# Patient Record
Sex: Male | Born: 1995 | Hispanic: Yes | Marital: Single | Smoking: Never smoker
Health system: Southern US, Community
[De-identification: ages and names within clinical notes are randomized; demographics above are authoritative.]

## PROBLEM LIST (undated history)

## (undated) HISTORY — PX: TONSILLECTOMY: SUR1361

---
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
## 2017-10-25 ENCOUNTER — Other Ambulatory Visit: Payer: Self-pay

## 2017-10-25 ENCOUNTER — Encounter: Payer: Self-pay | Admitting: Emergency Medicine

## 2017-10-25 ENCOUNTER — Emergency Department
Admission: EM | Admit: 2017-10-25 | Discharge: 2017-10-25 | Disposition: A | Discharge status: Home / Self Care | Payer: Medicaid Other | Attending: Emergency Medicine | Admitting: Emergency Medicine

## 2017-10-25 ENCOUNTER — Emergency Department: Payer: Medicaid Other

## 2017-10-25 DIAGNOSIS — R1031 Right lower quadrant pain: Secondary | ICD-10-CM | POA: Diagnosis not present

## 2017-10-25 LAB — URINALYSIS, COMPLETE (UACMP) WITH MICROSCOPIC
BACTERIA UA: NONE SEEN
BILIRUBIN URINE: NEGATIVE
Glucose, UA: NEGATIVE mg/dL
HGB URINE DIPSTICK: NEGATIVE
KETONES UR: NEGATIVE mg/dL
LEUKOCYTES UA: NEGATIVE
NITRITE: NEGATIVE
PH: 8 (ref 5.0–8.0)
Protein, ur: NEGATIVE mg/dL
RBC / HPF: NONE SEEN RBC/hpf (ref 0–5)
SPECIFIC GRAVITY, URINE: 1.01 (ref 1.005–1.030)
Squamous Epithelial / LPF: NONE SEEN
WBC UA: NONE SEEN WBC/hpf (ref 0–5)

## 2017-10-25 LAB — LIPASE, BLOOD: Lipase: 28 U/L (ref 11–51)

## 2017-10-25 LAB — COMPREHENSIVE METABOLIC PANEL
ALBUMIN: 4.8 g/dL (ref 3.5–5.0)
ALK PHOS: 87 U/L (ref 38–126)
ALT: 16 U/L — ABNORMAL LOW (ref 17–63)
ANION GAP: 9 (ref 5–15)
AST: 24 U/L (ref 15–41)
BILIRUBIN TOTAL: 0.6 mg/dL (ref 0.3–1.2)
BUN: 13 mg/dL (ref 6–20)
CALCIUM: 9.6 mg/dL (ref 8.9–10.3)
CO2: 27 mmol/L (ref 22–32)
Chloride: 104 mmol/L (ref 101–111)
Creatinine, Ser: 0.81 mg/dL (ref 0.61–1.24)
GFR calc Af Amer: >60 mL/min (ref >60)
GLUCOSE: 95 mg/dL (ref 65–99)
POTASSIUM: 4 mmol/L (ref 3.5–5.1)
Sodium: 140 mmol/L (ref 135–145)
TOTAL PROTEIN: 7.5 g/dL (ref 6.5–8.1)

## 2017-10-25 LAB — CBC
HEMATOCRIT: 43 % (ref 40.0–52.0)
Hemoglobin: 15.1 g/dL (ref 13.0–18.0)
MCH: 31.5 pg (ref 26.0–34.0)
MCHC: 35.2 g/dL (ref 32.0–36.0)
MCV: 89.3 fL (ref 80.0–100.0)
Platelets: 227 10*3/uL (ref 150–440)
RBC: 4.81 MIL/uL (ref 4.40–5.90)
RDW: 12.5 % (ref 11.5–14.5)
WBC: 9.7 10*3/uL (ref 3.8–10.6)

## 2017-10-25 MED ORDER — KETOROLAC TROMETHAMINE 30 MG/ML IJ SOLN
15.0000 mg | Freq: Once | INTRAMUSCULAR | Status: AC
  Administered 2017-10-25: 15 mg via INTRAVENOUS
  Filled 2017-10-25: qty 1

## 2017-10-25 MED ORDER — IOPAMIDOL (ISOVUE-300) INJECTION 61%
100.0000 mL | Freq: Once | INTRAVENOUS | Status: AC | PRN
  Administered 2017-10-25: 100 mL via INTRAVENOUS
  Filled 2017-10-25: qty 100

## 2017-10-25 NOTE — ED Provider Notes (Signed)
[facility] Emergency Department Provider Note  ____________________________________________  Time seen: Approximately 9:21 PM  I have reviewed the triage vital signs and the nursing notes.   HISTORY  Chief Complaint Abdominal Pain   HPI Lenard Gallowayrik Aleman Gutierrez is a 21 y.o. male no significant past medical history who presents for evaluation of periumbilical abdominal pain. Symptoms started yesterday. He describes the pain as sharp, 6 out of 10, located in his periumbilical/right lower quadrant, nonradiating. No nausea, vomiting, anorexia, fever, chills, dysuria, hematuria. Patient denies ever having similar pain.  History reviewed. No pertinent past medical history.  There are no active problems to display for this patient.   Past Surgical History:  Procedure Laterality Date  . TONSILLECTOMY      Prior to Admission medications   Not on File    Allergies Patient has no known allergies.  No family history on file.  Social History Social History   Tobacco Use  . Smoking status: Never Smoker  . Smokeless tobacco: Never Used  Substance Use Topics  . Alcohol use: No    Frequency: Never  . Drug use: No    Review of Systems  Constitutional: Negative for fever. Eyes: Negative for visual changes. ENT: Negative for sore throat. Neck: No neck pain  Cardiovascular: Negative for chest pain. Respiratory: Negative for shortness of breath. Gastrointestinal: + periumbilical/ RLQ abdominal pain. No vomiting or diarrhea. Genitourinary: Negative for dysuria. Musculoskeletal: Negative for back pain. Skin: Negative for rash. Neurological: Negative for headaches, weakness or numbness. Psych: No SI or HI  ____________________________________________   PHYSICAL EXAM:  VITAL SIGNS: ED Triage Vitals  Enc Vitals Group     BP 10/25/17 2026 (!) 143/65     Pulse Rate 10/25/17 2026 (!) 59     Resp 10/25/17 2026 18     Temp 10/25/17 2026 98.7 F  (37.1 C)     Temp Source 10/25/17 2026 Oral     SpO2 10/25/17 2026 100 %     Weight 10/25/17 2026 170 lb (77.1 kg)     Height 10/25/17 2026 5\' 8"  (1.727 m)     Head Circumference --      Peak Flow --      Pain Score 10/25/17 2025 5     Pain Loc --      Pain Edu? --      Excl. in GC? --     Constitutional: Alert and oriented. Well appearing and in no apparent distress. HEENT:      Head: Normocephalic and atraumatic.         Eyes: Conjunctivae are normal. Sclera is non-icteric.       Mouth/Throat: Mucous membranes are moist.       Neck: Supple with no signs of meningismus. Cardiovascular: Regular rate and rhythm. No murmurs, gallops, or rubs. 2+ symmetrical distal pulses are present in all extremities. No JVD. Respiratory: Normal respiratory effort. Lungs are clear to auscultation bilaterally. No wheezes, crackles, or rhonchi.  Gastrointestinal: Soft, ttp over the periumbilical and RLQ, and non distended with positive bowel sounds. No rebound or guarding. Bilateral testicles are descended with no tenderness to palpation, bilateral positive cremasteric reflexes are present, no swelling or erythema of the scrotum. No evidence of inguinal hernia. Musculoskeletal: Nontender with normal range of motion in all extremities. No edema, cyanosis, or erythema of extremities. Neurologic: Normal speech and language. Face is symmetric. Moving all extremities. No gross focal neurologic deficits are appreciated. Skin: Skin is warm, dry and intact.  No rash noted. Psychiatric: Mood and affect are normal. Speech and behavior are normal.  ____________________________________________   LABS (all labs ordered are listed, but only abnormal results are displayed)  Labs Reviewed  COMPREHENSIVE METABOLIC PANEL - Abnormal; Notable for the following components:      Result Value   ALT 16 (*)    All other components within normal limits  URINALYSIS, COMPLETE (UACMP) WITH MICROSCOPIC - Abnormal; Notable for  the following components:   Color, Urine STRAW (*)    APPearance CLEAR (*)    All other components within normal limits  CBC  LIPASE, BLOOD   ____________________________________________  EKG  none  ____________________________________________  RADIOLOGY  CT a/p: Negative, normal appendix ____________________________________________   PROCEDURES  Procedure(s) performed: None Procedures Critical Care performed:  None ____________________________________________   INITIAL IMPRESSION / ASSESSMENT AND PLAN / ED COURSE   21 y.o. male no significant past medical history who presents for evaluation of periumbilical abdominal pain since yesterday. Patient is well-appearing, in no distress, normal vital signs, he is tender to palpation on the periumbilical and right lower quadrant with no rebound or guarding. GU exam WNL. Ddx including appendicitis vs colitis vs diverticulitis vs epiploic appendagitis. Labs WNL. Will get CT a/p, will give toradol for pain.    _________________________ 10:25 PM on 10/25/2017 -----------------------------------------  CT and labs WNL with no acute findings. Pain resolved after toradol Will dc home on supportive care and f/u with PCP. Discussed return precautions with patient and mother   As part of my medical decision making, I reviewed the following data within the electronic MEDICAL RECORD NUMBER Nursing notes reviewed and incorporated, Labs reviewed , Radiograph reviewed , Notes from prior ED visits and [place] Controlled Substance Database    Pertinent labs & imaging results that were available during my care of the patient were reviewed by me and considered in my medical decision making (see chart for details).    ____________________________________________   FINAL CLINICAL IMPRESSION(S) / ED DIAGNOSES  Final diagnoses:  Right lower quadrant abdominal pain      NEW MEDICATIONS STARTED DURING THIS VISIT:  ED Discharge Orders    None        Note:  This document was prepared using Dragon voice recognition software and may include unintentional dictation errors.    Nita SickleVeronese, [place], MD 10/25/17 2227

## 2017-10-25 NOTE — ED Triage Notes (Signed)
Pt reports RLQ abd pain since Sunday. Denies vomiting or nausea. Pain increases with movement and touch. Pain also increases after eating. Last bowel movement was "a couple hours ago" and was said to be "normal". Denies fever at home.

## 2017-10-25 NOTE — Discharge Instructions (Signed)

## 2017-10-25 NOTE — ED Notes (Signed)
Patient transported to CT 

## 2018-09-07 ENCOUNTER — Emergency Department: Payer: No Typology Code available for payment source

## 2018-09-07 ENCOUNTER — Emergency Department
Admission: EM | Admit: 2018-09-07 | Discharge: 2018-09-08 | Disposition: A | Discharge status: Home / Self Care | Payer: No Typology Code available for payment source | Attending: Emergency Medicine | Admitting: Emergency Medicine

## 2018-09-07 DIAGNOSIS — M25522 Pain in left elbow: Secondary | ICD-10-CM | POA: Insufficient documentation

## 2018-09-07 DIAGNOSIS — T148XXA Other injury of unspecified body region, initial encounter: Secondary | ICD-10-CM | POA: Diagnosis not present

## 2018-09-07 DIAGNOSIS — R079 Chest pain, unspecified: Secondary | ICD-10-CM | POA: Insufficient documentation

## 2018-09-07 DIAGNOSIS — S8011XA Contusion of right lower leg, initial encounter: Secondary | ICD-10-CM | POA: Diagnosis not present

## 2018-09-07 DIAGNOSIS — M542 Cervicalgia: Secondary | ICD-10-CM | POA: Insufficient documentation

## 2018-09-07 DIAGNOSIS — Y92411 Interstate highway as the place of occurrence of the external cause: Secondary | ICD-10-CM | POA: Insufficient documentation

## 2018-09-07 DIAGNOSIS — R52 Pain, unspecified: Secondary | ICD-10-CM

## 2018-09-07 DIAGNOSIS — Y999 Unspecified external cause status: Secondary | ICD-10-CM | POA: Diagnosis not present

## 2018-09-07 DIAGNOSIS — Y9389 Activity, other specified: Secondary | ICD-10-CM | POA: Insufficient documentation

## 2018-09-07 DIAGNOSIS — R51 Headache: Secondary | ICD-10-CM | POA: Diagnosis not present

## 2018-09-07 DIAGNOSIS — S8991XA Unspecified injury of right lower leg, initial encounter: Secondary | ICD-10-CM | POA: Diagnosis present

## 2018-09-07 MED ORDER — ONDANSETRON 4 MG PO TBDP
4.0000 mg | ORAL_TABLET | Freq: Once | ORAL | Status: AC
  Administered 2018-09-07: 4 mg via ORAL
  Filled 2018-09-07: qty 1

## 2018-09-07 MED ORDER — HYDROMORPHONE HCL 1 MG/ML IJ SOLN
1.0000 mg | INTRAMUSCULAR | Status: AC
  Administered 2018-09-07: 1 mg via INTRAVENOUS

## 2018-09-07 MED ORDER — OXYCODONE-ACETAMINOPHEN 5-325 MG PO TABS: 1.0000 | ORAL_TABLET | ORAL | 0 refills | Status: DC | PRN

## 2018-09-07 MED ORDER — HYDROMORPHONE HCL 1 MG/ML IJ SOLN
INTRAMUSCULAR | Status: AC
  Administered 2018-09-07: 1 mg via INTRAVENOUS
  Filled 2018-09-07: qty 1

## 2018-09-07 MED ORDER — MORPHINE SULFATE (PF) 4 MG/ML IV SOLN
4.0000 mg | Freq: Once | INTRAVENOUS | Status: AC
  Administered 2018-09-07: 4 mg via INTRAMUSCULAR
  Filled 2018-09-07: qty 1

## 2018-09-07 MED ORDER — ONDANSETRON 4 MG PO TBDP: 4.0000 mg | ORAL_TABLET | Freq: Three times a day (TID) | ORAL | 0 refills | Status: DC | PRN
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
## 2018-09-07 NOTE — ED Notes (Signed)
C-collar in place. Pt sitting up and talking to MD without distress. Friend at bedside.
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
## 2019-01-24 ENCOUNTER — Emergency Department
Admission: EM | Admit: 2019-01-24 | Discharge: 2019-01-24 | Disposition: A | Discharge status: Home / Self Care | Payer: Medicaid Other | Attending: Emergency Medicine | Admitting: Emergency Medicine

## 2019-01-24 ENCOUNTER — Emergency Department: Payer: Medicaid Other

## 2019-01-24 ENCOUNTER — Encounter: Payer: Self-pay | Admitting: *Deleted

## 2019-01-24 ENCOUNTER — Other Ambulatory Visit: Payer: Self-pay

## 2019-01-24 DIAGNOSIS — J101 Influenza due to other identified influenza virus with other respiratory manifestations: Secondary | ICD-10-CM | POA: Insufficient documentation

## 2019-01-24 LAB — GROUP A STREP BY PCR: Group A Strep by PCR: NOT DETECTED

## 2019-01-24 LAB — INFLUENZA PANEL BY PCR (TYPE A & B)
Influenza A By PCR: POSITIVE — AB
Influenza B By PCR: NEGATIVE

## 2019-01-24 MED ORDER — ACETAMINOPHEN 500 MG PO TABS
1000.0000 mg | ORAL_TABLET | Freq: Once | ORAL | Status: AC
  Administered 2019-01-24: 1000 mg via ORAL

## 2019-01-24 MED ORDER — ACETAMINOPHEN 500 MG PO TABS
ORAL_TABLET | ORAL | Status: AC
  Filled 2019-01-24: qty 2

## 2019-01-24 MED ORDER — OSELTAMIVIR PHOSPHATE 75 MG PO CAPS: 75.0000 mg | ORAL_CAPSULE | Freq: Two times a day (BID) | ORAL | 0 refills | Status: AC

## 2019-01-24 NOTE — ED Notes (Signed)
Body aches and fever that began yesterday. Flu and strep swab sent. Po tylenol given in triage for fever.

## 2019-01-24 NOTE — ED Provider Notes (Signed)
[facility] Emergency Department Provider Note  ____________________________________________  Time seen: Approximately 8:24 PM  I have reviewed the triage vital signs and the nursing notes.   HISTORY  Chief Complaint Sore Throat and Cough    HPI Craig Peck is a 23 y.o. male presents to the emergency department with fever, headache, pharyngitis, body aches, chills and malaise for the past 24 hours.  Patient has had both emesis and diarrhea.  No recent travel outside of the country.  No known contact exposure with COVID 19.  Patient denies chest pain, chest tightness, shortness of breath or abdominal pain.  No other alleviating measures have been attempted.        No past medical history on file.  There are no active problems to display for this patient.   Past Surgical History:  Procedure Laterality Date  . TONSILLECTOMY      Prior to Admission medications   Medication Sig Start Date End Date Taking? Authorizing Provider  ondansetron (ZOFRAN ODT) 4 MG disintegrating tablet Take 1 tablet (4 mg total) by mouth every 8 (eight) hours as needed for nausea or vomiting. 09/07/18   Minna Antis, MD  oseltamivir (TAMIFLU) 75 MG capsule Take 1 capsule (75 mg total) by mouth 2 (two) times daily for 5 days. 01/24/19 01/29/19  Orvil Feil, PA-C  oxyCODONE-acetaminophen (PERCOCET) 5-325 MG tablet Take 1 tablet by mouth every 4 (four) hours as needed for severe pain. 09/07/18   Minna Antis, MD    Allergies Patient has no known allergies.  No family history on file.  Social History Social History   Tobacco Use  . Smoking status: Never Smoker  . Smokeless tobacco: Never Used  Substance Use Topics  . Alcohol use: No    Frequency: Never  . Drug use: No     Review of Systems  Constitutional: Patient has fever.  Eyes: No visual changes. No discharge ENT: Patient has congestion.  Cardiovascular: no chest pain. Respiratory:  Patient has cough.  Gastrointestinal: No abdominal pain.  No nausea, no vomiting. Patient had diarrhea.  Genitourinary: Negative for dysuria. No hematuria Musculoskeletal: Patient has myalgias.  Skin: Negative for rash, abrasions, lacerations, ecchymosis. Neurological: Patient has headache, no focal weakness or numbness.      ____________________________________________   PHYSICAL EXAM:  VITAL SIGNS: ED Triage Vitals  Enc Vitals Group     BP 01/24/19 1806 122/76     Pulse Rate 01/24/19 1806 (!) 108     Resp 01/24/19 1806 20     Temp 01/24/19 1806 (!) 103.1 F (39.5 C)     Temp Source 01/24/19 1806 Oral     SpO2 01/24/19 1807 96 %     Weight 01/24/19 1806 160 lb (72.6 kg)     Height 01/24/19 1806 5' (1.524 m)     Head Circumference --      Peak Flow --      Pain Score 01/24/19 1806 10     Pain Loc --      Pain Edu? --      Excl. in GC? --     Constitutional: Alert and oriented. Patient is lying supine. Eyes: Conjunctivae are normal. PERRL. EOMI. Head: Atraumatic. ENT:      Ears: Tympanic membranes are mildly injected with mild effusion bilaterally.       Nose: No congestion/rhinnorhea.      Mouth/Throat: Mucous membranes are moist. Posterior pharynx is mildly erythematous.  Hematological/Lymphatic/Immunilogical: No cervical lymphadenopathy.  Cardiovascular: Normal rate,  regular rhythm. Normal S1 and S2.  Good peripheral circulation. Respiratory: Normal respiratory effort without tachypnea or retractions. Lungs CTAB. Good air entry to the bases with no decreased or absent breath sounds. Gastrointestinal: Bowel sounds 4 quadrants. Soft and nontender to palpation. No guarding or rigidity. No palpable masses. No distention. No CVA tenderness. Musculoskeletal: Full range of motion to all extremities. No gross deformities appreciated. Neurologic:  Normal speech and language. No gross focal neurologic deficits are appreciated.  Skin:  Skin is warm, dry and intact. No rash  noted. Psychiatric: Mood and affect are normal. Speech and behavior are normal. Patient exhibits appropriate insight and judgement.    ____________________________________________   LABS (all labs ordered are listed, but only abnormal results are displayed)  Labs Reviewed  INFLUENZA PANEL BY PCR (TYPE A & B) - Abnormal; Notable for the following components:      Result Value   Influenza A By PCR POSITIVE (*)    All other components within normal limits  GROUP A STREP BY PCR   ____________________________________________  EKG   ____________________________________________  RADIOLOGY I personally viewed and evaluated these images as part of my medical decision making, as well as reviewing the written report by the radiologist.  Dg Chest 2 View  Result Date: 01/24/2019 CLINICAL DATA:  Cough and fevers EXAM: CHEST - 2 VIEW COMPARISON:  09/07/2018 FINDINGS: The heart size and mediastinal contours are within normal limits. Both lungs are clear. The visualized skeletal structures are unremarkable. IMPRESSION: No active cardiopulmonary disease. Electronically Signed   By: Alcide Clever M.D.   On: 01/24/2019 19:19    ____________________________________________    PROCEDURES  Procedure(s) performed:    Procedures    Medications  acetaminophen (TYLENOL) 500 MG tablet (has no administration in time range)  acetaminophen (TYLENOL) tablet 1,000 mg (1,000 mg Oral Given 01/24/19 1810)     ____________________________________________   INITIAL IMPRESSION / ASSESSMENT AND PLAN / ED COURSE  Pertinent labs & imaging results that were available during my care of the patient were reviewed by me and considered in my medical decision making (see chart for details).  Review of the [place] CSRS was performed in accordance of the [facility] prior to dispensing any controlled drugs.         Assessment and plan Influenza A Patient presents to the emergency department with flulike symptoms.   He tested positive for influenza A in the emergency department.  He opted to be treated empirically with Tamiflu.  Rest and hydration were encouraged at home.  Tylenol and ibuprofen alternating for fever recommended.  Strict return precautions were given to return to the emergency department for new or worsening symptoms.  All patient questions were answered.    ____________________________________________  FINAL CLINICAL IMPRESSION(S) / ED DIAGNOSES  Final diagnoses:  Influenza A      NEW MEDICATIONS STARTED DURING THIS VISIT:  ED Discharge Orders         Ordered    oseltamivir (TAMIFLU) 75 MG capsule  2 times daily     01/24/19 2021              This chart was dictated using voice recognition software/Dragon. Despite best efforts to proofread, errors can occur which can change the meaning. Any change was purely unintentional.    Gasper Lloyd 01/24/19 2028    Phineas Semen, MD 01/24/19 2035

## 2019-01-24 NOTE — ED Triage Notes (Signed)
Pt has a sore throat, cough and fever. Sx began yesterday.  Nonsmoker.  Pt alert.
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
## 2019-11-04 ENCOUNTER — Encounter: Payer: Self-pay | Admitting: Emergency Medicine

## 2019-11-04 ENCOUNTER — Other Ambulatory Visit: Payer: Self-pay

## 2019-11-04 ENCOUNTER — Emergency Department
Admission: EM | Admit: 2019-11-04 | Discharge: 2019-11-06 | Disposition: A | Discharge status: Home / Self Care | Payer: Medicaid Other | Attending: Emergency Medicine | Admitting: Emergency Medicine

## 2019-11-04 DIAGNOSIS — Z5321 Procedure and treatment not carried out due to patient leaving prior to being seen by health care provider: Secondary | ICD-10-CM | POA: Insufficient documentation

## 2019-11-04 DIAGNOSIS — R1031 Right lower quadrant pain: Secondary | ICD-10-CM | POA: Insufficient documentation

## 2019-11-04 DIAGNOSIS — R309 Painful micturition, unspecified: Secondary | ICD-10-CM | POA: Insufficient documentation

## 2019-11-04 LAB — CBC
HCT: 42.5 % (ref 39.0–52.0)
Hemoglobin: 15.2 g/dL (ref 13.0–17.0)
MCH: 30 pg (ref 26.0–34.0)
MCHC: 35.8 g/dL (ref 30.0–36.0)
MCV: 83.8 fL (ref 80.0–100.0)
Platelets: 273 10*3/uL (ref 150–400)
RBC: 5.07 MIL/uL (ref 4.22–5.81)
RDW: 11.9 % (ref 11.5–15.5)
WBC: 7.6 10*3/uL (ref 4.0–10.5)
nRBC: 0 % (ref 0.0–0.2)

## 2019-11-04 LAB — COMPREHENSIVE METABOLIC PANEL
ALT: 15 U/L (ref 0–44)
AST: 19 U/L (ref 15–41)
Albumin: 4.9 g/dL (ref 3.5–5.0)
Alkaline Phosphatase: 68 U/L (ref 38–126)
Anion gap: 10 (ref 5–15)
BUN: 9 mg/dL (ref 6–20)
CO2: 25 mmol/L (ref 22–32)
Calcium: 9.5 mg/dL (ref 8.9–10.3)
Chloride: 107 mmol/L (ref 98–111)
Creatinine, Ser: 0.89 mg/dL (ref 0.61–1.24)
GFR calc Af Amer: >60 mL/min (ref >60)
GFR calc non Af Amer: >60 mL/min (ref >60)
Glucose, Bld: 106 mg/dL — ABNORMAL HIGH (ref 70–99)
Potassium: 3.6 mmol/L (ref 3.5–5.1)
Sodium: 142 mmol/L (ref 135–145)
Total Bilirubin: 0.6 mg/dL (ref 0.3–1.2)
Total Protein: 7.8 g/dL (ref 6.5–8.1)

## 2019-11-04 LAB — URINALYSIS, COMPLETE (UACMP) WITH MICROSCOPIC
Bacteria, UA: NONE SEEN
Bilirubin Urine: NEGATIVE
Glucose, UA: NEGATIVE mg/dL
Hgb urine dipstick: NEGATIVE
Ketones, ur: NEGATIVE mg/dL
Leukocytes,Ua: NEGATIVE
Nitrite: NEGATIVE
Protein, ur: NEGATIVE mg/dL
Specific Gravity, Urine: 1.004 — ABNORMAL LOW (ref 1.005–1.030)
Squamous Epithelial / HPF: NONE SEEN (ref 0–5)
pH: 6 (ref 5.0–8.0)

## 2019-11-04 LAB — LIPASE, BLOOD: Lipase: 25 U/L (ref 11–51)

## 2019-11-04 NOTE — ED Triage Notes (Signed)
Pt ambulatory to triage with no difficulty. Reports pin to his right lower abd that started on Thursday. Pt denies n/v/d but reports occasional pain with urination. Pt thinks it could be his appendix.

## 2019-12-20 ENCOUNTER — Encounter: Payer: Self-pay | Admitting: Emergency Medicine

## 2019-12-20 ENCOUNTER — Emergency Department
Admission: EM | Admit: 2019-12-20 | Discharge: 2019-12-20 | Disposition: A | Discharge status: Home / Self Care | Payer: Medicaid Other | Attending: Student in an Organized Health Care Education/Training Program | Admitting: Student in an Organized Health Care Education/Training Program

## 2019-12-20 ENCOUNTER — Emergency Department: Payer: Medicaid Other

## 2019-12-20 ENCOUNTER — Other Ambulatory Visit: Payer: Self-pay

## 2019-12-20 DIAGNOSIS — J029 Acute pharyngitis, unspecified: Secondary | ICD-10-CM | POA: Insufficient documentation

## 2019-12-20 DIAGNOSIS — J209 Acute bronchitis, unspecified: Secondary | ICD-10-CM | POA: Insufficient documentation

## 2019-12-20 DIAGNOSIS — R05 Cough: Secondary | ICD-10-CM | POA: Insufficient documentation

## 2019-12-20 DIAGNOSIS — R0789 Other chest pain: Secondary | ICD-10-CM | POA: Insufficient documentation

## 2019-12-20 DIAGNOSIS — Z20822 Contact with and (suspected) exposure to covid-19: Secondary | ICD-10-CM | POA: Insufficient documentation

## 2019-12-20 MED ORDER — AZITHROMYCIN 250 MG PO TABS: ORAL_TABLET | ORAL | 0 refills | Status: DC

## 2019-12-20 MED ORDER — PREDNISONE 10 MG (21) PO TBPK: ORAL_TABLET | ORAL | 0 refills | Status: DC

## 2019-12-20 NOTE — Discharge Instructions (Signed)
Follow-up with your regular doctor if not improving in 3 days.  Return emergency department worsening.  Take medications as prescribed. 

## 2019-12-20 NOTE — ED Provider Notes (Signed)
[facility] Emergency Department Provider Note  ____________________________________________   First MD Initiated Contact with Patient 12/20/19 1020     (approximate)  I have reviewed the triage vital signs and the nursing notes.   HISTORY  Chief Complaint Cough and Sore Throat    HPI Craig Peck is a 24 y.o. male presents emergency department complaining of cough and chest hurting with the cough.  States symptoms for 1 week.  Had a negative Covid test last week.  He denies any cardiac type chest pain or shortness of breath.  He denies any swelling in extremities.  No fever or chills.  Remainder review of systems is negative    History reviewed. No pertinent past medical history.  There are no problems to display for this patient.   Past Surgical History:  Procedure Laterality Date  . TONSILLECTOMY      Prior to Admission medications   Medication Sig Start Date End Date Taking? Authorizing Provider  azithromycin (ZITHROMAX Z-PAK) 250 MG tablet 2 pills today then 1 pill a day for 4 days 12/20/19   Sherrie Mustache Roselyn Bering, PA-C  predniSONE (STERAPRED UNI-PAK 21 TAB) 10 MG (21) TBPK tablet Take 6 pills on day one then decrease by 1 pill each day 12/20/19   Faythe Ghee, PA-C    Allergies Patient has no known allergies.  No family history on file.  Social History Social History   Tobacco Use  . Smoking status: Never Smoker  . Smokeless tobacco: Never Used  Substance Use Topics  . Alcohol use: No  . Drug use: No    Review of Systems  Constitutional: No fever/chills Eyes: No visual changes. ENT: No sore throat. Respiratory: Positive cough with chest discomfort Cardiovascular: Denies chest pain Gastrointestinal: Denies abdominal pain Genitourinary: Negative for dysuria. Musculoskeletal: Negative for back pain. Skin: Negative for rash. Psychiatric: no mood changes,     ____________________________________________   PHYSICAL  EXAM:  VITAL SIGNS: ED Triage Vitals  Enc Vitals Group     BP 12/20/19 1019 (!) 114/92     Pulse Rate 12/20/19 1019 87     Resp 12/20/19 1019 18     Temp 12/20/19 1019 98.7 F (37.1 C)     Temp Source 12/20/19 1019 Oral     SpO2 12/20/19 1019 99 %     Weight 12/20/19 1003 172 lb (78 kg)     Height 12/20/19 1003 5\' 8"  (1.727 m)     Head Circumference --      Peak Flow --      Pain Score 12/20/19 1002 9     Pain Loc --      Pain Edu? --      Excl. in GC? --     Constitutional: Alert and oriented. Well appearing and in no acute distress. Eyes: Conjunctivae are normal.  Head: Atraumatic. Nose: No congestion/rhinnorhea. Mouth/Throat: Mucous membranes are moist.   Neck:  supple no lymphadenopathy noted Cardiovascular: Normal rate, regular rhythm. Heart sounds are normal Respiratory: Normal respiratory effort.  No retractions, lungs c t a  GU: deferred Musculoskeletal: FROM all extremities, warm and well perfused Neurologic:  Normal speech and language.  Skin:  Skin is warm, dry and intact. No rash noted. Psychiatric: Mood and affect are normal. Speech and behavior are normal.  ____________________________________________   LABS (all labs ordered are listed, but only abnormal results are displayed)  Labs Reviewed  SARS CORONAVIRUS 2 (TAT 6-24 HRS)   ____________________________________________   ____________________________________________  RADIOLOGY  Chest x-ray is normal  ____________________________________________   PROCEDURES  Procedure(s) performed: No  Procedures    ____________________________________________   INITIAL IMPRESSION / ASSESSMENT AND PLAN / ED COURSE  Pertinent labs & imaging results that were available during my care of the patient were reviewed by me and considered in my medical decision making (see chart for details).   Patient is 24 year old male presents emergency department Covid-like symptoms.  Cough and chest discomfort with  cough.  Remainder review systems is negative  Physical exam patient's vitals are normal.  He appears well.  Chest x-ray is normal  Explained findings to the patient.  He is placed on a Z-Pak and steroid pack.  We repeated his Covid test.  Kept him out of work until he receives his Covid test results.  States he understands and will comply.  He was discharged in stable condition.    Craig Peck was evaluated in Emergency Department on 12/20/2019 for the symptoms described in the history of present illness. He was evaluated in the context of the global COVID-19 pandemic, which necessitated consideration that the patient might be at risk for infection with the SARS-CoV-2 virus that causes COVID-19. Institutional protocols and algorithms that pertain to the evaluation of patients at risk for COVID-19 are in a state of rapid change based on information released by regulatory bodies including the CDC and federal and state organizations. These policies and algorithms were followed during the patient's care in the ED.   As part of my medical decision making, I reviewed the following data within the [place] notes reviewed and incorporated, Old chart reviewed, Radiograph reviewed x-ray is normal, Notes from prior ED visits and [place] Controlled Substance Database  ____________________________________________   FINAL CLINICAL IMPRESSION(S) / ED DIAGNOSES  Final diagnoses:  Suspected COVID-19 virus infection  Acute bronchitis, unspecified organism      NEW MEDICATIONS STARTED DURING THIS VISIT:  Current Discharge Medication List    START taking these medications   Details  azithromycin (ZITHROMAX Z-PAK) 250 MG tablet 2 pills today then 1 pill a day for 4 days Qty: 6 each, Refills: 0    predniSONE (STERAPRED UNI-PAK 21 TAB) 10 MG (21) TBPK tablet Take 6 pills on day one then decrease by 1 pill each day Qty: 21 tablet, Refills: 0         Note:  This  document was prepared using Dragon voice recognition software and may include unintentional dictation errors.    Versie Starks, PA-C 12/20/19 1125    Merlyn Lot, MD 12/20/19 1137

## 2019-12-20 NOTE — ED Triage Notes (Signed)
Pt reports productive cough with green sputum for the last couple of days. Pt reports pain to throat area as well.

## 2019-12-20 NOTE — ED Triage Notes (Signed)
Pt reports chest hurts when he coughs.

## 2019-12-20 NOTE — ED Notes (Signed)
Radiology to bedside. 

## 2019-12-21 LAB — SARS CORONAVIRUS 2 (TAT 6-24 HRS): SARS Coronavirus 2: NEGATIVE

## 2020-04-22 ENCOUNTER — Emergency Department
Admission: EM | Admit: 2020-04-22 | Discharge: 2020-04-22 | Disposition: A | Discharge status: Home / Self Care | Payer: Medicaid Other | Attending: Emergency Medicine | Admitting: Emergency Medicine

## 2020-04-22 ENCOUNTER — Emergency Department: Payer: Medicaid Other

## 2020-04-22 ENCOUNTER — Other Ambulatory Visit: Payer: Self-pay

## 2020-04-22 DIAGNOSIS — R0602 Shortness of breath: Secondary | ICD-10-CM | POA: Insufficient documentation

## 2020-04-22 DIAGNOSIS — R0789 Other chest pain: Secondary | ICD-10-CM | POA: Insufficient documentation

## 2020-04-22 DIAGNOSIS — Z5321 Procedure and treatment not carried out due to patient leaving prior to being seen by health care provider: Secondary | ICD-10-CM | POA: Insufficient documentation

## 2020-04-22 LAB — BASIC METABOLIC PANEL
Anion gap: 10 (ref 5–15)
BUN: 9 mg/dL (ref 6–20)
CO2: 25 mmol/L (ref 22–32)
Calcium: 9.4 mg/dL (ref 8.9–10.3)
Chloride: 107 mmol/L (ref 98–111)
Creatinine, Ser: 1.08 mg/dL (ref 0.61–1.24)
GFR calc Af Amer: >60 mL/min (ref >60)
GFR calc non Af Amer: >60 mL/min (ref >60)
Glucose, Bld: 102 mg/dL — ABNORMAL HIGH (ref 70–99)
Potassium: 3.9 mmol/L (ref 3.5–5.1)
Sodium: 142 mmol/L (ref 135–145)

## 2020-04-22 LAB — CBC
HCT: 45.1 % (ref 39.0–52.0)
Hemoglobin: 15.4 g/dL (ref 13.0–17.0)
MCH: 30.4 pg (ref 26.0–34.0)
MCHC: 34.1 g/dL (ref 30.0–36.0)
MCV: 89 fL (ref 80.0–100.0)
Platelets: 274 10*3/uL (ref 150–400)
RBC: 5.07 MIL/uL (ref 4.22–5.81)
RDW: 11.9 % (ref 11.5–15.5)
WBC: 8.5 10*3/uL (ref 4.0–10.5)
nRBC: 0 % (ref 0.0–0.2)

## 2020-04-22 LAB — TROPONIN I (HIGH SENSITIVITY): Troponin I (High Sensitivity): 3 ng/L (ref <18)

## 2020-04-22 MED ORDER — SODIUM CHLORIDE 0.9% FLUSH: 3.0000 mL | Freq: Once | INTRAVENOUS | Status: DC

## 2020-04-22 NOTE — ED Notes (Signed)
No answer when called several times from lobby & on phone # listed in chart

## 2020-04-22 NOTE — ED Notes (Signed)
No answer when called several times from lobby & on phone # listed in chart 

## 2020-04-22 NOTE — ED Triage Notes (Signed)
Pt comes via POV from home with c/o left sided CP and SOB. Pt states this started yesterday and has since gotten worse.  Pt states it is worse when he goes outside he has more trouble breathing.

## 2021-12-12 ENCOUNTER — Emergency Department: Payer: Self-pay

## 2021-12-12 ENCOUNTER — Emergency Department
Admission: EM | Admit: 2021-12-12 | Discharge: 2021-12-13 | Disposition: A | Discharge status: Home / Self Care | Payer: Self-pay | Attending: Emergency Medicine | Admitting: Emergency Medicine

## 2021-12-12 ENCOUNTER — Other Ambulatory Visit: Payer: Self-pay

## 2021-12-12 DIAGNOSIS — Z20822 Contact with and (suspected) exposure to covid-19: Secondary | ICD-10-CM | POA: Insufficient documentation

## 2021-12-12 DIAGNOSIS — A084 Viral intestinal infection, unspecified: Secondary | ICD-10-CM | POA: Insufficient documentation

## 2021-12-12 DIAGNOSIS — R319 Hematuria, unspecified: Secondary | ICD-10-CM | POA: Insufficient documentation

## 2021-12-12 LAB — COMPREHENSIVE METABOLIC PANEL
ALT: 18 U/L (ref 0–44)
AST: 21 U/L (ref 15–41)
Albumin: 3.9 g/dL (ref 3.5–5.0)
Alkaline Phosphatase: 43 U/L (ref 38–126)
Anion gap: 9 (ref 5–15)
BUN: 8 mg/dL (ref 6–20)
CO2: 27 mmol/L (ref 22–32)
Calcium: 9 mg/dL (ref 8.9–10.3)
Chloride: 103 mmol/L (ref 98–111)
Creatinine, Ser: 0.9 mg/dL (ref 0.61–1.24)
GFR, Estimated: >60 mL/min (ref >60)
Glucose, Bld: 102 mg/dL — ABNORMAL HIGH (ref 70–99)
Potassium: 3.5 mmol/L (ref 3.5–5.1)
Sodium: 139 mmol/L (ref 135–145)
Total Bilirubin: 0.7 mg/dL (ref 0.3–1.2)
Total Protein: 6.9 g/dL (ref 6.5–8.1)

## 2021-12-12 LAB — URINALYSIS, ROUTINE W REFLEX MICROSCOPIC
Bilirubin Urine: NEGATIVE
Glucose, UA: NEGATIVE mg/dL
Hgb urine dipstick: NEGATIVE
Leukocytes,Ua: NEGATIVE
Nitrite: NEGATIVE
Protein, ur: NEGATIVE mg/dL
Specific Gravity, Urine: 1.02 (ref 1.005–1.030)
pH: 5.5 (ref 5.0–8.0)

## 2021-12-12 LAB — RESP PANEL BY RT-PCR (FLU A&B, COVID) ARPGX2
Influenza A by PCR: NEGATIVE
Influenza B by PCR: NEGATIVE
SARS Coronavirus 2 by RT PCR: NEGATIVE

## 2021-12-12 LAB — CBC
HCT: 44.7 % (ref 39.0–52.0)
Hemoglobin: 15.7 g/dL (ref 13.0–17.0)
MCH: 31.5 pg (ref 26.0–34.0)
MCHC: 35.1 g/dL (ref 30.0–36.0)
MCV: 89.6 fL (ref 80.0–100.0)
Platelets: 225 10*3/uL (ref 150–400)
RBC: 4.99 MIL/uL (ref 4.22–5.81)
RDW: 11.6 % (ref 11.5–15.5)
WBC: 5.7 10*3/uL (ref 4.0–10.5)
nRBC: 0 % (ref 0.0–0.2)

## 2021-12-12 LAB — LIPASE, BLOOD: Lipase: 25 U/L (ref 11–51)

## 2021-12-12 MED ORDER — DICYCLOMINE HCL 10 MG PO CAPS: 10.0000 mg | ORAL_CAPSULE | Freq: Three times a day (TID) | ORAL | 0 refills | Status: DC | PRN

## 2021-12-12 MED ORDER — ONDANSETRON 4 MG PO TBDP: 4.0000 mg | ORAL_TABLET | Freq: Three times a day (TID) | ORAL | 0 refills | Status: DC | PRN

## 2021-12-12 NOTE — ED Notes (Signed)
Pt provided urine cup

## 2021-12-12 NOTE — ED Provider Notes (Signed)
[facility] Provider Note  Patient Contact: 9:23 PM (approximate)   History   No chief complaint on file.   HPI  Craig Peck is a 26 y.o. male who presents emergency department for 2 to 3 days of symptoms of nausea, vomiting.  Patient states that today he noticed some blood in his urine.  He has been having some diffuse abdominal pain with what he described as intermittent pain into his groin but that is all been present for several months.  No history of nephrolithiasis.  Patient denies any difficulty urinating.  Patient states that while he has been having some of these viral GI symptoms, his family has also been experiencing same.  They seem to be improving though the patient has not improved at this time.  No nasal congestion, sore throat or cough.  No chronic GI complaints.     Physical Exam   Triage Vital Signs: ED Triage Vitals  Enc Vitals Group     BP 12/12/21 1814 107/87     Pulse Rate 12/12/21 1814 77     Resp 12/12/21 2051 14     Temp 12/12/21 1814 98.2 F (36.8 C)     Temp Source 12/12/21 1814 Oral     SpO2 12/12/21 1814 100 %     Weight 12/12/21 1811 160 lb (72.6 kg)     Height 12/12/21 1811 5\' 8"  (1.727 m)     Head Circumference --      Peak Flow --      Pain Score 12/12/21 1811 6     Pain Loc --      Pain Edu? --      Excl. in GC? --     Most recent vital signs: Vitals:   12/12/21 1814 12/12/21 2051  BP: 107/87   Pulse: 77   Resp:  14  Temp: 98.2 F (36.8 C)   SpO2: 100%      General: Alert and in no acute distress. ENT:      Ears:       Nose: No congestion/rhinnorhea.      Mouth/Throat: Mucous membranes are moist. Neck: No stridor. No cervical spine tenderness to palpation Hematological/Lymphatic/Immunilogical: No cervical lymphadenopathy. Cardiovascular:  Good peripheral perfusion Respiratory: Normal respiratory effort without tachypnea or retractions. Lungs CTAB. Good air entry to the bases with no  decreased or absent breath sounds. Gastrointestinal: Bowel sounds 4 quadrants. Soft and nontender to palpation. No guarding or rigidity. No palpable masses. No distention. No CVA tenderness. Musculoskeletal: Full range of motion to all extremities.  Neurologic:  No gross focal neurologic deficits are appreciated.  Skin:   No rash noted Other:   ED Results / Procedures / Treatments   Labs (all labs ordered are listed, but only abnormal results are displayed) Labs Reviewed  COMPREHENSIVE METABOLIC PANEL - Abnormal; Notable for the following components:      Result Value   Glucose, Bld 102 (*)    All other components within normal limits  URINALYSIS, ROUTINE W REFLEX MICROSCOPIC - Abnormal; Notable for the following components:   Ketones, ur TRACE (*)    All other components within normal limits  RESP PANEL BY RT-PCR (FLU A&B, COVID) ARPGX2  LIPASE, BLOOD  CBC     EKG     RADIOLOGY  I personally viewed and evaluated these images as part of my medical decision making, as well as reviewing the written report by the radiologist.  ED Provider Interpretation: No acute findings on  CT scan of the abdomen and pelvis specifically no appendicitis, cholecystitis, nephrolithiasis  CT Renal Stone Study  Result Date: 12/12/2021 CLINICAL DATA:  Flank pain, kidney stone suspected. Vomiting. Hematuria. EXAM: CT ABDOMEN AND PELVIS WITHOUT CONTRAST TECHNIQUE: Multidetector CT imaging of the abdomen and pelvis was performed following the standard protocol without IV contrast. RADIATION DOSE REDUCTION: This exam was performed according to the departmental dose-optimization program which includes automated exposure control, adjustment of the mA and/or kV according to patient size and/or use of iterative reconstruction technique. COMPARISON:  CT abdomen dated 10/25/2017. FINDINGS: Lower chest: No acute abnormality. Hepatobiliary: No focal liver abnormality is seen. Gallbladder appears normal. No bile  duct dilatation seen. Pancreas: Unremarkable. No pancreatic ductal dilatation or surrounding inflammatory changes. Spleen: Normal in size without focal abnormality. Adrenals/Urinary Tract: Adrenal glands appear normal. Kidneys are unremarkable without stone or hydronephrosis. No perinephric inflammation. No ureteral or bladder calculi are identified. Bladder is unremarkable, partially decompressed. Stomach/Bowel: No dilated large or small bowel loops. No evidence of bowel wall inflammation. Appendix is normal. Stomach is unremarkable. Vascular/Lymphatic: No abdominal aortic aneurysm. No enlarged lymph nodes are seen within the abdomen or pelvis. Reproductive: Prostate is unremarkable. Other: No free fluid or abscess collection is seen within the abdomen or pelvis. No free intraperitoneal air. Musculoskeletal: Osseous structures are unremarkable. Superficial soft tissues of the abdomen and pelvis are unremarkable. IMPRESSION: No acute findings within the abdomen or pelvis. No renal or ureteral calculi. No bowel obstruction or evidence of bowel wall inflammation. Appendix is normal. Electronically Signed   By: Franki Cabot M.D.   On: 12/12/2021 21:55    PROCEDURES:  Critical Care performed: No  Procedures   MEDICATIONS ORDERED IN ED: Medications - No data to display   IMPRESSION / MDM / [place] / ED COURSE  I reviewed the triage vital signs and the nursing notes.                              Differential diagnosis includes, but is not limited to, viral illness, nephrolithiasis, appendicitis, cholecystitis   Patient's diagnosis is consistent with viral illness.  Patient presented to the emergency department with GI symptoms which the family is sharing.  Patient states that multiple family members have nausea, vomiting, diarrhea.  Patient is having similar symptoms but had some blood in his urine earlier today.  This is cleared up and has had no return of same.  No history of kidney  stones.  Overall exam was reassuring and patient has not been vomiting while here.  Given the blood in his urine patient did have a CT scan to ensure no evidence of nephrolithiasis.  CT was reassuring with no kidney stones, bowel obstruction, colitis, cholecystitis or appendicitis.  Patient will have symptom control medicines of Zofran and Bentyl.  Follow-up primary care as needed.  Return precautions discussed with the patient at this time. Patient is given ED precautions to return to the ED for any worsening or new symptoms.       FINAL CLINICAL IMPRESSION(S) / ED DIAGNOSES   Final diagnoses:  Viral gastroenteritis  Hematuria, unspecified type     Rx / DC Orders   ED Discharge Orders     None        Note:  This document was prepared using Dragon voice recognition software and may include unintentional dictation errors.   Brynda Peon 12/12/21 2306    Carrie Mew,  MD 12/16/21 1526

## 2021-12-12 NOTE — ED Notes (Signed)
Pt requesting no visitors, first nurse (Tiffany) called and informed not to let anyone back for pt. Note left on ED tracker as well.

## 2021-12-12 NOTE — ED Triage Notes (Signed)
Pt to ED via POV. Pt stating he started to vomited and went to the restroom and noticed blood in his urine this morning. Pt reports pain in his groin and abdomen. Pt denies abnormal discharge.

## 2021-12-13 NOTE — ED Notes (Signed)
E signature pad not working. Pt educated on discharge instructions and verbalized understanding.  

## 2022-02-28 ENCOUNTER — Inpatient Hospital Stay
Admission: EM | Admit: 2022-02-28 | Discharge: 2022-03-05 | DRG: 683 | Disposition: A | Discharge status: Home / Self Care | Payer: Medicaid Other | Attending: Internal Medicine | Admitting: Internal Medicine

## 2022-02-28 ENCOUNTER — Other Ambulatory Visit: Payer: Self-pay

## 2022-02-28 ENCOUNTER — Emergency Department: Payer: Medicaid Other

## 2022-02-28 ENCOUNTER — Encounter: Payer: Self-pay | Admitting: *Deleted

## 2022-02-28 DIAGNOSIS — L299 Pruritus, unspecified: Secondary | ICD-10-CM

## 2022-02-28 DIAGNOSIS — R109 Unspecified abdominal pain: Principal | ICD-10-CM

## 2022-02-28 DIAGNOSIS — R809 Proteinuria, unspecified: Secondary | ICD-10-CM | POA: Diagnosis present

## 2022-02-28 DIAGNOSIS — F17290 Nicotine dependence, other tobacco product, uncomplicated: Secondary | ICD-10-CM | POA: Diagnosis present

## 2022-02-28 DIAGNOSIS — R17 Unspecified jaundice: Secondary | ICD-10-CM | POA: Diagnosis present

## 2022-02-28 DIAGNOSIS — M549 Dorsalgia, unspecified: Secondary | ICD-10-CM

## 2022-02-28 DIAGNOSIS — R030 Elevated blood-pressure reading, without diagnosis of hypertension: Secondary | ICD-10-CM | POA: Diagnosis present

## 2022-02-28 DIAGNOSIS — N179 Acute kidney failure, unspecified: Principal | ICD-10-CM | POA: Diagnosis present

## 2022-02-28 LAB — PROTEIN / CREATININE RATIO, URINE
Creatinine, Urine: 96 mg/dL
Protein Creatinine Ratio: 3.06 mg/mg{Cre} — ABNORMAL HIGH (ref 0.00–0.15)
Total Protein, Urine: 294 mg/dL

## 2022-02-28 LAB — HEPATITIS B SURFACE ANTIGEN: Hepatitis B Surface Ag: NONREACTIVE

## 2022-02-28 LAB — COMPREHENSIVE METABOLIC PANEL
ALT: 20 U/L (ref 0–44)
AST: 28 U/L (ref 15–41)
Albumin: 4.3 g/dL (ref 3.5–5.0)
Alkaline Phosphatase: 57 U/L (ref 38–126)
Anion gap: 8 (ref 5–15)
BUN: 26 mg/dL — ABNORMAL HIGH (ref 6–20)
CO2: 24 mmol/L (ref 22–32)
Calcium: 8.8 mg/dL — ABNORMAL LOW (ref 8.9–10.3)
Chloride: 107 mmol/L (ref 98–111)
Creatinine, Ser: 2.3 mg/dL — ABNORMAL HIGH (ref 0.61–1.24)
GFR, Estimated: 40 mL/min — ABNORMAL LOW (ref >60)
Glucose, Bld: 93 mg/dL (ref 70–99)
Potassium: 3.8 mmol/L (ref 3.5–5.1)
Sodium: 139 mmol/L (ref 135–145)
Total Bilirubin: 1.6 mg/dL — ABNORMAL HIGH (ref 0.3–1.2)
Total Protein: 6.9 g/dL (ref 6.5–8.1)

## 2022-02-28 LAB — URINALYSIS, ROUTINE W REFLEX MICROSCOPIC
Bilirubin Urine: NEGATIVE
Glucose, UA: NEGATIVE mg/dL
Ketones, ur: NEGATIVE mg/dL
Leukocytes,Ua: NEGATIVE
Nitrite: NEGATIVE
Protein, ur: >=300 mg/dL — AB
Specific Gravity, Urine: 1.005 (ref 1.005–1.030)
Squamous Epithelial / HPF: NONE SEEN (ref 0–5)
pH: 6 (ref 5.0–8.0)

## 2022-02-28 LAB — RETICULOCYTES
Immature Retic Fract: 5.3 % (ref 2.3–15.9)
RBC.: 4.35 MIL/uL (ref 4.22–5.81)
Retic Count, Absolute: 46.5 10*3/uL (ref 19.0–186.0)
Retic Ct Pct: 1.1 % (ref 0.4–3.1)

## 2022-02-28 LAB — CBC WITH DIFFERENTIAL/PLATELET
Abs Immature Granulocytes: 0.04 10*3/uL (ref 0.00–0.07)
Basophils Absolute: 0 10*3/uL (ref 0.0–0.1)
Basophils Relative: 0 %
Eosinophils Absolute: 0.1 10*3/uL (ref 0.0–0.5)
Eosinophils Relative: 1 %
HCT: 41 % (ref 39.0–52.0)
Hemoglobin: 13.8 g/dL (ref 13.0–17.0)
Immature Granulocytes: 0 %
Lymphocytes Relative: 9 %
Lymphs Abs: 0.9 10*3/uL (ref 0.7–4.0)
MCH: 30.3 pg (ref 26.0–34.0)
MCHC: 33.7 g/dL (ref 30.0–36.0)
MCV: 89.9 fL (ref 80.0–100.0)
Monocytes Absolute: 1.1 10*3/uL — ABNORMAL HIGH (ref 0.1–1.0)
Monocytes Relative: 12 %
Neutro Abs: 7.3 10*3/uL (ref 1.7–7.7)
Neutrophils Relative %: 78 %
Platelets: 178 10*3/uL (ref 150–400)
RBC: 4.56 MIL/uL (ref 4.22–5.81)
RDW: 11.8 % (ref 11.5–15.5)
WBC: 9.5 10*3/uL (ref 4.0–10.5)
nRBC: 0 % (ref 0.0–0.2)

## 2022-02-28 LAB — HEMOGLOBIN A1C
Hgb A1c MFr Bld: 5.3 % (ref 4.8–5.6)
Mean Plasma Glucose: 105.41 mg/dL

## 2022-02-28 LAB — HIV ANTIBODY (ROUTINE TESTING W REFLEX): HIV Screen 4th Generation wRfx: NONREACTIVE

## 2022-02-28 LAB — CK: Total CK: 163 U/L (ref 49–397)

## 2022-02-28 MED ORDER — CYCLOBENZAPRINE HCL 10 MG PO TABS
10.0000 mg | ORAL_TABLET | Freq: Three times a day (TID) | ORAL | Status: DC | PRN
  Administered 2022-02-28: 10 mg via ORAL
  Filled 2022-02-28: qty 1

## 2022-02-28 MED ORDER — HYDROXYZINE HCL 25 MG PO TABS
25.0000 mg | ORAL_TABLET | Freq: Three times a day (TID) | ORAL | Status: DC | PRN
  Administered 2022-02-28: 25 mg via ORAL
  Filled 2022-02-28: qty 1

## 2022-02-28 MED ORDER — TRAMADOL HCL 50 MG PO TABS
50.0000 mg | ORAL_TABLET | Freq: Three times a day (TID) | ORAL | Status: DC | PRN
  Administered 2022-02-28: 50 mg via ORAL
  Filled 2022-02-28: qty 1

## 2022-02-28 MED ORDER — SODIUM CHLORIDE 0.9 % IV BOLUS
1000.0000 mL | Freq: Once | INTRAVENOUS | Status: AC
  Administered 2022-02-28: 1000 mL via INTRAVENOUS

## 2022-02-28 MED ORDER — HYDROMORPHONE HCL 1 MG/ML IJ SOLN
1.0000 mg | Freq: Once | INTRAMUSCULAR | Status: AC
  Administered 2022-02-28: 1 mg via INTRAVENOUS
  Filled 2022-02-28: qty 1

## 2022-02-28 MED ORDER — KETOROLAC TROMETHAMINE 15 MG/ML IJ SOLN
15.0000 mg | Freq: Once | INTRAMUSCULAR | Status: AC
Start: 2022-02-28 — End: 2022-02-28
  Administered 2022-02-28: 15 mg via INTRAVENOUS
  Filled 2022-02-28: qty 1

## 2022-02-28 MED ORDER — SODIUM CHLORIDE 0.9% FLUSH
3.0000 mL | Freq: Two times a day (BID) | INTRAVENOUS | Status: DC
  Administered 2022-02-28 – 2022-03-04 (×9): 3 mL via INTRAVENOUS

## 2022-02-28 MED ORDER — TRAZODONE HCL 50 MG PO TABS
25.0000 mg | ORAL_TABLET | Freq: Every evening | ORAL | Status: DC | PRN
  Administered 2022-03-01: 25 mg via ORAL
  Filled 2022-02-28: qty 1

## 2022-02-28 MED ORDER — LACTATED RINGERS IV SOLN: INTRAVENOUS | Status: DC

## 2022-02-28 MED ORDER — ONDANSETRON HCL 4 MG PO TABS
4.0000 mg | ORAL_TABLET | Freq: Four times a day (QID) | ORAL | Status: DC | PRN
  Filled 2022-02-28: qty 1

## 2022-02-28 MED ORDER — ACETAMINOPHEN 500 MG PO TABS
1000.0000 mg | ORAL_TABLET | Freq: Once | ORAL | Status: AC
  Administered 2022-02-28: 1000 mg via ORAL
  Filled 2022-02-28: qty 2

## 2022-02-28 MED ORDER — HYDROMORPHONE HCL 1 MG/ML IJ SOLN
0.5000 mg | INTRAMUSCULAR | Status: DC | PRN
  Administered 2022-02-28 – 2022-03-04 (×12): 1 mg via INTRAVENOUS
  Filled 2022-02-28 (×12): qty 1

## 2022-02-28 MED ORDER — OXYCODONE HCL 5 MG PO TABS
5.0000 mg | ORAL_TABLET | ORAL | Status: DC | PRN
  Administered 2022-02-28 – 2022-03-04 (×9): 5 mg via ORAL
  Filled 2022-02-28 (×10): qty 1

## 2022-02-28 MED ORDER — ONDANSETRON HCL 4 MG/2ML IJ SOLN
4.0000 mg | Freq: Four times a day (QID) | INTRAMUSCULAR | Status: DC | PRN
  Administered 2022-03-02: 4 mg via INTRAVENOUS
  Filled 2022-02-28: qty 2

## 2022-02-28 MED ORDER — POLYETHYLENE GLYCOL 3350 17 G PO PACK
17.0000 g | PACK | Freq: Every day | ORAL | Status: DC | PRN
  Administered 2022-03-03: 17 g via ORAL
  Filled 2022-02-28: qty 1

## 2022-02-28 NOTE — Assessment & Plan Note (Deleted)
Initially had high suspicion for rhabdomyolysis, however CK returned normal.  Does have large amount of protein in his urine, will check urine protein creatinine ratio as initial screen for possible nephropathy, however lack of other stigmata such as rash or other things associated with IgA nephropathy/HSP make this less likely.  Low suspicion as well for hemolytic process, elevated bilirubin would be consistent however platelets being normal would argue against this as well.  Could simply be prerenal, will hydrate overnight and reassess.  Reports strong family history of kidney stones, however none seen on imaging and no hydronephrosis appreciated. ?- LR 125 cc an hour ?- Follow-up urine protein creatinine ratio ?- Check peripheral smear ?- Pain meds and antiemetics as needed ?

## 2022-02-28 NOTE — Assessment & Plan Note (Addendum)
Creatinine continue to rise but less increased than previous.  Creatinine 4.56 today.  Kidney biopsy results still pending.  I explained the kidneys are quick to impair and slow to improve. ?

## 2022-02-28 NOTE — Assessment & Plan Note (Addendum)
Bilirubin in normal range.  This has resolved. ?

## 2022-02-28 NOTE — ED Notes (Signed)
Patient taken to CT scan.

## 2022-02-28 NOTE — ED Provider Notes (Signed)
? ?[facility] ?Provider Note ? ? ? Event Date/Time  ? First MD Initiated Contact with Patient 02/28/22 1024   ?  (approximate) ? ? ?History  ? ?Chief Complaint ?Back Pain ? ? ?HPI ?Craig Peck is a 26 y.o. male, no remarkable medical history, presents via EMS to the emergency department for evaluation of flank pain.  Patient states that he began experiencing mid back pain radiating to the sides of his flank yesterday that has persisted into today.  Additionally states that he has vomited twice today and yesterday.  Denies any recent illnesses or injuries.  Denies any recent heavy lifting.  Denies fever/chills, chest pain, shortness of breath, diarrhea, dysuria, rash/lesions, or numbness/tingling in upper or lower extremities. ? ?History Limitations: No limitations. ? ?    ? ? ?Physical Exam  ?Triage Vital Signs: ?ED Triage Vitals  ?Enc Vitals Group  ?   BP   ?   Pulse   ?   Resp   ?   Temp   ?   Temp src   ?   SpO2   ?   Weight   ?   Height   ?   Head Circumference   ?   Peak Flow   ?   Pain Score   ?   Pain Loc   ?   Pain Edu?   ?   Excl. in GC?   ? ? ?Most recent vital signs: ?Vitals:  ? 02/28/22 1200 02/28/22 1451  ?BP: 122/83 (!) 131/97  ?Pulse: 60 (!) 59  ?Resp:  18  ?Temp:  97.8 ?F (36.6 ?C)  ?SpO2: 100% 100%  ? ? ?General: Awake, appears uncomfortable and in pain. ?Skin: Warm, dry. No rashes or lesions.  ?Eyes: PERRL. Conjunctivae normal.  ?CV: Good peripheral perfusion.  ?Resp: Normal effort.  ?Abd: Soft, non-tender. No distention.  CVA tenderness appreciated bilaterally. ?Neuro: At baseline. No gross neurological deficits.  ? ?Focused Exam: Not applicable. ? ?Physical Exam ? ? ? ?ED Results / Procedures / Treatments  ?Labs ?(all labs ordered are listed, but only abnormal results are displayed) ?Labs Reviewed  ?URINALYSIS, ROUTINE W REFLEX MICROSCOPIC - Abnormal; Notable for the following components:  ?    Result Value  ? Color, Urine YELLOW (*)   ? APPearance CLEAR (*)   ?  Hgb urine dipstick MODERATE (*)   ? Protein, ur >=300 (*)   ? Bacteria, UA RARE (*)   ? All other components within normal limits  ?CBC WITH DIFFERENTIAL/PLATELET - Abnormal; Notable for the following components:  ? Monocytes Absolute 1.1 (*)   ? All other components within normal limits  ?COMPREHENSIVE METABOLIC PANEL - Abnormal; Notable for the following components:  ? BUN 26 (*)   ? Creatinine, Ser 2.30 (*)   ? Calcium 8.8 (*)   ? Total Bilirubin 1.6 (*)   ? GFR, Estimated 40 (*)   ? All other components within normal limits  ?PROTEIN / CREATININE RATIO, URINE - Abnormal; Notable for the following components:  ? Protein Creatinine Ratio 3.06 (*)   ? All other components within normal limits  ?URINE CULTURE  ?CK  ?RETICULOCYTES  ?HIV ANTIBODY (ROUTINE TESTING W REFLEX)  ?PATHOLOGIST SMEAR REVIEW  ?SAVE SMEAR(SSMR), FOR PROVIDER SLIDE REVIEW  ?PROTEIN, URINE, 24 HOUR  ?CREATININE, URINE, 24 HOUR  ?ANA W/REFLEX  ?ANCA PROFILE  ?C3 COMPLEMENT  ?C4 COMPLEMENT  ?COMPLEMENT, TOTAL  ?PROTEIN ELECTROPHORESIS, SERUM  ?UPEP/UIFE/LIGHT CHAINS/TP, 24-HR UR  ?RPR  ?HEPATITIS  B SURFACE ANTIGEN  ?HCV AB W REFLEX TO QUANT PCR  ?CRYOGLOBULIN  ?HEMOGLOBIN A1C  ? ? ? ?EKG ?Not applicable. ? ? ?RADIOLOGY ? ?ED Provider Interpretation: I personally reviewed this CT study, no evidence of nephrolithiasis. ? ?CT Renal Stone Study ? ?Result Date: 02/28/2022 ?CLINICAL DATA:  26 year old male with history of mid back pain radiating to both sides, worsening with urination. Emesis. EXAM: CT ABDOMEN AND PELVIS WITHOUT CONTRAST TECHNIQUE: Multidetector CT imaging of the abdomen and pelvis was performed following the standard protocol without IV contrast. RADIATION DOSE REDUCTION: This exam was performed according to the departmental dose-optimization program which includes automated exposure control, adjustment of the mA and/or kV according to patient size and/or use of iterative reconstruction technique. COMPARISON:  CT of the abdomen and  pelvis 12/12/2020. FINDINGS: Lower chest: Unremarkable. Hepatobiliary: No definite suspicious cystic or solid hepatic lesions are confidently identified on today's noncontrast CT examination. Unenhanced appearance of the gallbladder is normal. Pancreas: No definite pancreatic mass or peripancreatic fluid collections or inflammatory changes are noted on today's noncontrast CT examination. Spleen: Unremarkable. Adrenals/Urinary Tract: There are no abnormal calcifications within the collecting system of either kidney, along the course of either ureter, or within the lumen of the urinary bladder. No hydroureteronephrosis or perinephric stranding to suggest urinary tract obstruction at this time. The unenhanced appearance of the kidneys is unremarkable bilaterally. Unenhanced appearance of the urinary bladder is normal. Bilateral adrenal glands are normal in appearance. Stomach/Bowel: Unenhanced appearance of the stomach is normal. There is no pathologic dilatation of small bowel or colon. Normal appendix. Vascular/Lymphatic: No atherosclerotic calcifications are noted in the abdominal aorta or pelvic vasculature. No lymphadenopathy noted in the abdomen or pelvis. Reproductive: Prostate gland and seminal vesicles are unremarkable in appearance. Other: No significant volume of ascites.  No pneumoperitoneum. Musculoskeletal: There are no aggressive appearing lytic or blastic lesions noted in the visualized portions of the skeleton. IMPRESSION: 1. No acute findings are noted in the abdomen or pelvis to account for the patient's symptoms. Specifically, no urinary tract calculi no findings of urinary tract obstruction are noted at this time. Electronically Signed   By: Trudie Reed M.D.   On: 02/28/2022 10:55   ? ?PROCEDURES: ? ?Critical Care performed: None. ? ?Procedures ? ? ? ?MEDICATIONS ORDERED IN ED: ?Medications  ?sodium chloride flush (NS) 0.9 % injection 3 mL (3 mLs Intravenous Given 02/28/22 1503)  ?lactated  ringers infusion ( Intravenous New Bag/Given 02/28/22 1500)  ?traMADol (ULTRAM) tablet 50 mg (has no administration in time range)  ?oxyCODONE (Oxy IR/ROXICODONE) immediate release tablet 5 mg (5 mg Oral Given 02/28/22 1453)  ?traZODone (DESYREL) tablet 25 mg (has no administration in time range)  ?polyethylene glycol (MIRALAX / GLYCOLAX) packet 17 g (has no administration in time range)  ?ondansetron (ZOFRAN) tablet 4 mg (has no administration in time range)  ?  Or  ?ondansetron (ZOFRAN) injection 4 mg (has no administration in time range)  ?acetaminophen (TYLENOL) tablet 1,000 mg (1,000 mg Oral Given 02/28/22 1108)  ?ketorolac (TORADOL) 15 MG/ML injection 15 mg (15 mg Intravenous Given 02/28/22 1106)  ?sodium chloride 0.9 % bolus 1,000 mL (0 mLs Intravenous Stopped 02/28/22 1218)  ?HYDROmorphone (DILAUDID) injection 1 mg (1 mg Intravenous Given 02/28/22 1224)  ?sodium chloride 0.9 % bolus 1,000 mL (1,000 mLs Intravenous New Bag/Given 02/28/22 1221)  ? ? ? ?IMPRESSION / MDM / ASSESSMENT AND PLAN / ED COURSE  ?I reviewed the triage vital signs and the nursing notes. ?             ?               ? ?  Differential diagnosis includes, but is not limited to, thoracic/lumbar strain, nephrolithiasis, cystitis, pyelonephritis, glomerulonephritis, rhabdomyolysis, nephrotic syndrome. ? ?ED Course ?Patient appears uncomfortable, but stable.  Vitals are within normal limits.  We will go ahead treat pain with acetaminophen and ketorolac. ? ?CBC shows no leukocytosis or anemia. ? ?Urinalysis shows moderate hemoglobin and proteinuria, otherwise no evidence of infection. ? ?CMP notable for very elevated creatinine at 2.3.  Last creatinine was 0.9 approximately 2 months prior. ? ? ? ?Assessment/Plan ?Patient presents with bilateral flank pain.  No evidence of renal calculi on CT scan, or any other acute process that may explain the patient's symptoms.  However, the patient does have significant proteinuria on urinalysis, as well as  significantly elevated creatinine on CMP.  Unclear etiology at this time.  Will admit for further work-up and evaluation.  Spoke with the on-call hospitalist who agreed to admission. ? ?  ? ? ?FINAL CLINICAL IMPRESSIO

## 2022-02-28 NOTE — ED Notes (Signed)
Report given to John RN.

## 2022-02-28 NOTE — ED Triage Notes (Signed)
Per EMS report, patient began feeling mid-back pain that radiates on both sides to the flank. Pain increases with urination. Patient has vomited twice today and yesterday. Patient appears uncomfortable. ?

## 2022-02-28 NOTE — Progress Notes (Addendum)
Patient UPCR has returned at 3.06, concerning for nephrotic syndrome. ? ?I have requested Nephrology consultation and sent off laboratory work up.  ? ? ?Addendum ?Went to bedside to speak with patient, his mother was there as well. 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 Mother wondering if frequent plasma donation could be related to this, I told her no. She was also wondering if his frequent use of vaping products could be contributing, and I told her I did not know, but it was a good question for when they speak with the nephrologist. All questions answered, advised patient there would be a different rounder in the AM. ? ?5:21 PM ?02/28/22 ? ?Venora Maples, MD/MPH ?

## 2022-02-28 NOTE — H&P (Signed)
?History and Physical  ? ? ?Patient: Craig Peck [id number] DOB: 1996-03-01 ?DOA: 02/28/2022 ?DOS: the patient was seen and examined on 02/28/2022 ?PCP: [facility]  ?Patient coming from: Home ? ?Chief Complaint:  ?Chief Complaint  ?Patient presents with  ? Back Pain  ? ?HPI: Craig Peck is a 26 y.o. male with no significant past medical history who presents for bilateral flank pain. ? ?Patient reports that starting yesterday he began to feel bilateral back pain that wraps around to his abdomen.  He described his pain as constant and sharp, worse with movement.  He works Soil scientist, and has done so for the past 6 years.  He reports he always maintains good water intake, especially when he is working out in the sun for long periods.  He did take some ibuprofen and Tylenol yesterday but those were the first time he had for a while, he does not regularly take these medications.  He reports he does not work out, does not take any unusual supplements, does not eat any unusual foods.  He does notice some change in the color of his urine, since yesterday it has been a reddish color.  He has not noticed any rashes, has not been ill of late except for some worsening allergies, no sick contacts. ? ?In the ED initial vital signs notable only for borderline hypertension but otherwise normal.  CMP showed creatinine of 2.3, was 0.9 approximately 2 months prior.  Only other abnormality of note on CMP was total bilirubin of 1.6, had been 0.7 on last recheck.  CBC was unremarkable.  UA showed low specific gravity, high level of protein, and moderate hemoglobin dipstick however microscopy did not show any significant amount of blood.  CT renal stone study was obtained which was largely unremarkable. He was given 2 L NS bolus, Toradol and Dilaudid for pain control, and admitted for further management. ?  ?Review of Systems: As mentioned in the history of present illness. All other  systems reviewed and are negative. ?History reviewed. No pertinent past medical history. ?Past Surgical History:  ?Procedure Laterality Date  ? TONSILLECTOMY    ? ?Social History:  reports that he has never smoked. He has never used smokeless tobacco. He reports current alcohol use. He reports that he does not use drugs. ? ?No Known Allergies ? ?No family history on file. ? ?Prior to Admission medications   ?Medication Sig Start Date End Date Taking? Authorizing Provider  ?azithromycin (ZITHROMAX Z-PAK) 250 MG tablet 2 pills today then 1 pill a day for 4 days 12/20/19   Versie Starks, PA-C  ?dicyclomine (BENTYL) 10 MG capsule Take 1 capsule (10 mg total) by mouth 3 (three) times daily as needed (abdominal cramping). 12/12/21   Cuthriell, Charline Bills, PA-C  ?ondansetron (ZOFRAN-ODT) 4 MG disintegrating tablet Take 1 tablet (4 mg total) by mouth every 8 (eight) hours as needed for nausea or vomiting. 12/12/21   Cuthriell, Charline Bills, PA-C  ?predniSONE (STERAPRED UNI-PAK 21 TAB) 10 MG (21) TBPK tablet Take 6 pills on day one then decrease by 1 pill each day 12/20/19   Versie Starks, PA-C  ? ? ?Physical Exam: ?Vitals:  ? 02/28/22 1034 02/28/22 1100 02/28/22 1130 02/28/22 1200  ?BP:  127/88 133/90 122/83  ?Pulse:  (!) 54 (!) 56 60  ?Resp:      ?Temp:      ?TempSrc:      ?SpO2:  98% 100% 100%  ?Weight: 62.6 kg     ?  Height: 5\' 8"  (1.727 m)     ? ?Physical Exam ?Constitutional:   ?   General: He is not in acute distress. ?   Appearance: Normal appearance. He is not toxic-appearing or diaphoretic.  ?Eyes:  ?   General: No scleral icterus. ?Cardiovascular:  ?   Rate and Rhythm: Normal rate and regular rhythm.  ?   Heart sounds: Normal heart sounds. No murmur heard. ?Abdominal:  ?   General: Abdomen is flat. Bowel sounds are normal. There is no distension.  ?   Palpations: Abdomen is soft. There is no mass.  ?   Tenderness: There is abdominal tenderness.  ?   Comments: Diffuse tenderness to palpation  ?Musculoskeletal:     ?    General: Tenderness present.  ?   Right lower leg: No edema.  ?   Left lower leg: No edema.  ?   Comments: Very tender to light palpation of all back muscles starting around lower thoracics  ?Skin: ?   General: Skin is warm and dry.  ?   Findings: No rash.  ?   Comments: No rash appreciated on abdomen or bilateral shins  ?Neurological:  ?   General: No focal deficit present.  ?   Mental Status: He is alert and oriented to person, place, and time.  ?Psychiatric:     ?   Mood and Affect: Mood normal.     ?   Behavior: Behavior normal.  ? ? ?Data Reviewed: ? ?There are no new results to review at this time. ? ?Results for orders placed or performed during the hospital encounter of 02/28/22 (from the past 24 hour(s))  ?Urinalysis, Routine w reflex microscopic Urine, Clean Catch     Status: Abnormal  ? Collection Time: 02/28/22 10:39 AM  ?Result Value Ref Range  ? Color, Urine YELLOW (A) YELLOW  ? APPearance CLEAR (A) CLEAR  ? Specific Gravity, Urine 1.005 1.005 - 1.030  ? pH 6.0 5.0 - 8.0  ? Glucose, UA NEGATIVE NEGATIVE mg/dL  ? Hgb urine dipstick MODERATE (A) NEGATIVE  ? Bilirubin Urine NEGATIVE NEGATIVE  ? Ketones, ur NEGATIVE NEGATIVE mg/dL  ? Protein, ur >=300 (A) NEGATIVE mg/dL  ? Nitrite NEGATIVE NEGATIVE  ? Leukocytes,Ua NEGATIVE NEGATIVE  ? RBC / HPF 0-5 0 - 5 RBC/hpf  ? WBC, UA 0-5 0 - 5 WBC/hpf  ? Bacteria, UA RARE (A) NONE SEEN  ? Squamous Epithelial / LPF NONE SEEN 0 - 5  ?CBC with Differential     Status: Abnormal  ? Collection Time: 02/28/22 10:56 AM  ?Result Value Ref Range  ? WBC 9.5 4.0 - 10.5 K/uL  ? RBC 4.56 4.22 - 5.81 MIL/uL  ? Hemoglobin 13.8 13.0 - 17.0 g/dL  ? HCT 41.0 39.0 - 52.0 %  ? MCV 89.9 80.0 - 100.0 fL  ? MCH 30.3 26.0 - 34.0 pg  ? MCHC 33.7 30.0 - 36.0 g/dL  ? RDW 11.8 11.5 - 15.5 %  ? Platelets 178 150 - 400 K/uL  ? nRBC 0.0 0.0 - 0.2 %  ? Neutrophils Relative % 78 %  ? Neutro Abs 7.3 1.7 - 7.7 K/uL  ? Lymphocytes Relative 9 %  ? Lymphs Abs 0.9 0.7 - 4.0 K/uL  ? Monocytes Relative 12 %   ? Monocytes Absolute 1.1 (H) 0.1 - 1.0 K/uL  ? Eosinophils Relative 1 %  ? Eosinophils Absolute 0.1 0.0 - 0.5 K/uL  ? Basophils Relative 0 %  ? Basophils Absolute 0.0  0.0 - 0.1 K/uL  ? Immature Granulocytes 0 %  ? Abs Immature Granulocytes 0.04 0.00 - 0.07 K/uL  ?Comprehensive metabolic panel     Status: Abnormal  ? Collection Time: 02/28/22 10:56 AM  ?Result Value Ref Range  ? Sodium 139 135 - 145 mmol/L  ? Potassium 3.8 3.5 - 5.1 mmol/L  ? Chloride 107 98 - 111 mmol/L  ? CO2 24 22 - 32 mmol/L  ? Glucose, Bld 93 70 - 99 mg/dL  ? BUN 26 (H) 6 - 20 mg/dL  ? Creatinine, Ser 2.30 (H) 0.61 - 1.24 mg/dL  ? Calcium 8.8 (L) 8.9 - 10.3 mg/dL  ? Total Protein 6.9 6.5 - 8.1 g/dL  ? Albumin 4.3 3.5 - 5.0 g/dL  ? AST 28 15 - 41 U/L  ? ALT 20 0 - 44 U/L  ? Alkaline Phosphatase 57 38 - 126 U/L  ? Total Bilirubin 1.6 (H) 0.3 - 1.2 mg/dL  ? GFR, Estimated 40 (L) >60 mL/min  ? Anion gap 8 5 - 15  ?CK     Status: None  ? Collection Time: 02/28/22 10:56 AM  ?Result Value Ref Range  ? Total CK 163 49 - 397 U/L  ? ?CT Renal Stone Study ? ?Result Date: 02/28/2022 ?CLINICAL DATA:  26 year old male with history of mid back pain radiating to both sides, worsening with urination. Emesis. EXAM: CT ABDOMEN AND PELVIS WITHOUT CONTRAST TECHNIQUE: Multidetector CT imaging of the abdomen and pelvis was performed following the standard protocol without IV contrast. RADIATION DOSE REDUCTION: This exam was performed according to the departmental dose-optimization program which includes automated exposure control, adjustment of the mA and/or kV according to patient size and/or use of iterative reconstruction technique. COMPARISON:  CT of the abdomen and pelvis 12/12/2020. FINDINGS: Lower chest: Unremarkable. Hepatobiliary: No definite suspicious cystic or solid hepatic lesions are confidently identified on today's noncontrast CT examination. Unenhanced appearance of the gallbladder is normal. Pancreas: No definite pancreatic mass or peripancreatic  fluid collections or inflammatory changes are noted on today's noncontrast CT examination. Spleen: Unremarkable. Adrenals/Urinary Tract: There are no abnormal calcifications within the collecting system of e

## 2022-02-28 NOTE — Assessment & Plan Note (Deleted)
Noncontrast CT without any apparent abnormalities of the gallbladder or liver.  Normal platelets make hemolysis less likely but will need to follow-up this possibility pending peripheral smear.  Also theoretically could be due to dehydration, but would expect LFTs to be elevated as well. ?- Follow-up above studies, trend CMP ?

## 2022-02-28 NOTE — ED Notes (Signed)
Informed RN bed assigned 

## 2022-02-28 NOTE — Progress Notes (Signed)
Pt complaining of itching all over, continues to complain of flank pain stated oxycodone didn't help. Dr. Crissie Reese notified.per MD will put orders in.will continue to monitor ?

## 2022-03-01 DIAGNOSIS — N179 Acute kidney failure, unspecified: Secondary | ICD-10-CM

## 2022-03-01 LAB — CBC
HCT: 38.7 % — ABNORMAL LOW (ref 39.0–52.0)
Hemoglobin: 13.1 g/dL (ref 13.0–17.0)
MCH: 31 pg (ref 26.0–34.0)
MCHC: 33.9 g/dL (ref 30.0–36.0)
MCV: 91.7 fL (ref 80.0–100.0)
Platelets: 153 10*3/uL (ref 150–400)
RBC: 4.22 MIL/uL (ref 4.22–5.81)
RDW: 11.8 % (ref 11.5–15.5)
WBC: 8.2 10*3/uL (ref 4.0–10.5)
nRBC: 0 % (ref 0.0–0.2)

## 2022-03-01 LAB — COMPREHENSIVE METABOLIC PANEL
ALT: 14 U/L (ref 0–44)
AST: 21 U/L (ref 15–41)
Albumin: 3.6 g/dL (ref 3.5–5.0)
Alkaline Phosphatase: 47 U/L (ref 38–126)
Anion gap: 4 — ABNORMAL LOW (ref 5–15)
BUN: 27 mg/dL — ABNORMAL HIGH (ref 6–20)
CO2: 24 mmol/L (ref 22–32)
Calcium: 8.4 mg/dL — ABNORMAL LOW (ref 8.9–10.3)
Chloride: 107 mmol/L (ref 98–111)
Creatinine, Ser: 2.81 mg/dL — ABNORMAL HIGH (ref 0.61–1.24)
GFR, Estimated: 31 mL/min — ABNORMAL LOW (ref >60)
Glucose, Bld: 107 mg/dL — ABNORMAL HIGH (ref 70–99)
Potassium: 3.8 mmol/L (ref 3.5–5.1)
Sodium: 135 mmol/L (ref 135–145)
Total Bilirubin: 0.9 mg/dL (ref 0.3–1.2)
Total Protein: 5.8 g/dL — ABNORMAL LOW (ref 6.5–8.1)

## 2022-03-01 LAB — GROUP A STREP BY PCR: Group A Strep by PCR: NOT DETECTED

## 2022-03-01 LAB — HCV INTERPRETATION

## 2022-03-01 LAB — RPR: RPR Ser Ql: NONREACTIVE

## 2022-03-01 LAB — CHLAMYDIA/NGC RT PCR (ARMC ONLY)
Chlamydia Tr: NOT DETECTED
N gonorrhoeae: NOT DETECTED

## 2022-03-01 LAB — SAVE SMEAR(SSMR), FOR PROVIDER SLIDE REVIEW

## 2022-03-01 LAB — COMPLEMENT, TOTAL: Compl, Total (CH50): 57 U/mL (ref >41)

## 2022-03-01 LAB — HCV AB W REFLEX TO QUANT PCR: HCV Ab: NONREACTIVE

## 2022-03-01 LAB — CK: Total CK: 113 U/L (ref 49–397)

## 2022-03-01 MED ORDER — DIPHENHYDRAMINE HCL 50 MG/ML IJ SOLN
25.0000 mg | Freq: Four times a day (QID) | INTRAMUSCULAR | Status: DC | PRN
  Administered 2022-03-01 – 2022-03-03 (×2): 25 mg via INTRAVENOUS
  Filled 2022-03-01 (×2): qty 1

## 2022-03-01 MED ORDER — METHOCARBAMOL 500 MG PO TABS
750.0000 mg | ORAL_TABLET | Freq: Three times a day (TID) | ORAL | Status: DC
  Administered 2022-03-01 – 2022-03-02 (×5): 750 mg via ORAL
  Filled 2022-03-01 (×7): qty 2

## 2022-03-01 MED ORDER — ACETAMINOPHEN 325 MG PO TABS: 650.0000 mg | ORAL_TABLET | Freq: Four times a day (QID) | ORAL | Status: DC | PRN

## 2022-03-01 NOTE — Plan of Care (Signed)
  Problem: Coping: Goal: Level of anxiety will decrease Outcome: Progressing   Problem: Pain Managment: Goal: General experience of comfort will improve Outcome: Progressing   

## 2022-03-01 NOTE — Progress Notes (Addendum)
Cross Cover ?Informed from nurse patient , with mother at bedside wanted to sign out AMA and go to different hospital. ?Discussed with patient alone in his room regarding concerns. Obtained additional history including strep throat exposure of his 24 year old daughter.   ?Has 3 sex partners but uses condoms consistently.  Has had intermittent gray/white penile discharge.  Admits to having intermittent fevers.  ?Experiencing itching since start of pain meds and no relief with vistaril.  Changed to benadryl. ?Throat swab for strepA, urine NAAT for chlamydia and gonorrhea  ? ? ?

## 2022-03-01 NOTE — Consult Note (Signed)
?[facility]  ?CONSULT NOTE  ? ? ?Date: 03/01/2022      ?      ?      ?Patient Name:  Craig Peck  MRN: [id number]  ?DOB: November 08, 1996  Age / Sex: 26 y.o., male   ?      ?[facility]P: [facility]     ?      ?      ?Service Requesting Consult: TRH     ?      ?      ?Reason for Consult: Acute kidney injury     ?      ? ?History of Present Illness: ?Mr. Craig Peck is a 26 y.o.  male with no significant past medical history, who was [facility] 02/28/2022 for AKI (acute kidney injury) (HCC) [N17.9] ? ?Patient presents to the emergency department with back pain he states began on Friday.  Unable to identify any precipitating causes.  Patient is seen this morning with mother and brother at bedside.  Denies nausea, vomiting and no change in appetite reported.  He states he has noticed bubbles/frothy urine.  Unable to state when this began.  Father has history of kidney stones.  No family history of kidney disease or dialysis.  He has worked in concrete for 4 years.  His mother states he does take supplements sent from [place], injectable vitamins and male enhancement.  Brother also states patient vapes.  No current primary care physician. ? ?Labs on arrival show creatinine 2.3 with GFR 40, bilirubin 1.6, BUN 26.  Strep a, GC and Chlamydia negative.  UA positive for proteins.  Urine culture pending.  CT renal stone shows no renal obstruction. ? ? ?Medications: ?Outpatient medications: ?Medications Prior to Admission  ?Medication Sig Dispense Refill Last Dose  ? dicyclomine (BENTYL) 10 MG capsule Take 1 capsule (10 mg total) by mouth 3 (three) times daily as needed (abdominal cramping). (Patient not taking: Reported on 02/28/2022) 30 capsule 0 Not Taking  ? ondansetron (ZOFRAN-ODT) 4 MG disintegrating tablet Take 1 tablet (4 mg total) by mouth every 8 (eight) hours as needed for nausea or vomiting. (Patient not taking: Reported on 02/28/2022) 20 tablet 0 Not Taking   ? ? ?Current medications: ?Current Facility-Administered Medications  ?Medication Dose Route Frequency Provider Last Rate Last Admin  ? acetaminophen (TYLENOL) tablet 650 mg  650 mg Oral Q6H PRN Sreenath, Sudheer B, MD      ? diphenhydrAMINE (BENADRYL) injection 25 mg  25 mg Intravenous Q6H PRN Manuela SchwartzMorrison, Brenda, NP   25 mg at 03/01/22 0229  ? HYDROmorphone (DILAUDID) injection 0.5-1 mg  0.5-1 mg Intravenous Q4H PRN Venora MaplesEckstat, Matthew M, MD   1 mg at 03/01/22 0401  ? lactated ringers infusion   Intravenous Continuous Lolita PatellaSreenath, Sudheer B, MD 150 mL/hr at 03/01/22 0902 Rate Change at 03/01/22 0902  ? methocarbamol (ROBAXIN) tablet 750 mg  750 mg Oral TID Lolita PatellaSreenath, Sudheer B, MD   750 mg at 03/01/22 0902  ? ondansetron (ZOFRAN) tablet 4 mg  4 mg Oral Q6H PRN Venora MaplesEckstat, Matthew M, MD      ? Or  ? ondansetron [facility](ZOFRAN) injection 4 mg  4 mg Intravenous Q6H PRN Venora MaplesEckstat, Matthew M, MD      ? oxyCODONE (Oxy IR/ROXICODONE) immediate release tablet 5 mg  5 mg Oral Q4H PRN Venora MaplesEckstat, Matthew M, MD   5 mg at 03/01/22 0149  ? polyethylene glycol (MIRALAX / GLYCOLAX) packet 17 g  17 g Oral Daily PRN Venora Maples, MD      ? sodium chloride flush (NS) 0.9 % injection 3 mL  3 mL Intravenous Q12H Venora Maples, MD   3 mL at 03/01/22 0800  ? traZODone (DESYREL) tablet 25 mg  25 mg Oral QHS PRN Venora Maples, MD   25 mg at 03/01/22 0149  ?  ? ? ?Allergies: ?No Known Allergies  ? ? ?Past Medical History: ?History reviewed. No pertinent past medical history. ? ? ?Past Surgical History: ?Past Surgical History:  ?Procedure Laterality Date  ? TONSILLECTOMY    ? ? ? ?Family History: ?No family history on file. ? ? ?Social History: ?Social History  ? ?Socioeconomic History  ? Marital status: Single  ?  Spouse name: Not on file  ? Number of children: Not on file  ? Years of education: Not on file  ? Highest education level: Not on file  ?Occupational History  ? Not on file  ?Tobacco Use  ? Smoking status: Never  ? Smokeless tobacco:  Never  ?Vaping Use  ? Vaping Use: Some days  ?Substance and Sexual Activity  ? Alcohol use: Yes  ?  Comment: one a week  ? Drug use: No  ? Sexual activity: Not on file  ?Other Topics Concern  ? Not on file  ?Social History Narrative  ? Not on file  ? ?Social Determinants of Health  ? ?Financial Resource Strain: Not on file  ?Food Insecurity: Not on file  ?Transportation Needs: Not on file  ?Physical Activity: Not on file  ?Stress: Not on file  ?Social Connections: Not on file  ?Intimate Partner Violence: Not on file  ? ? ? ?Review of Systems: ?Review of Systems  ?Constitutional:  Negative for chills, fever and malaise/fatigue.  ?HENT:  Negative for congestion, sore throat and tinnitus.   ?Eyes:  Negative for blurred vision and redness.  ?Respiratory:  Negative for cough, shortness of breath and wheezing.   ?Cardiovascular:  Negative for chest pain, palpitations, claudication and leg swelling.  ?Gastrointestinal:  Negative for abdominal pain, blood in stool, diarrhea, nausea and vomiting.  ?Genitourinary:  Negative for flank pain, frequency and hematuria.  ?Musculoskeletal:  Positive for back pain. Negative for falls and myalgias.  ?Skin:  Negative for rash.  ?Neurological:  Negative for dizziness, weakness and headaches.  ?Endo/Heme/Allergies:  Does not bruise/bleed easily.  ?Psychiatric/Behavioral:  Negative for depression. The patient is not nervous/anxious and does not have insomnia.   ? ?Vital Signs: ?Blood pressure 109/79, pulse (!) 59, temperature 98.7 ?F (37.1 ?C), temperature source Oral, resp. rate 18, height 5\' 8"  (1.727 m), weight 62.6 kg, SpO2 91 %. ? ?Weight trends: ?Filed Weights  ? 02/28/22 1034  ?Weight: 62.6 kg  ? ? ?Physical Exam: ?General: NAD, laying in bed  ?Head: Normocephalic, atraumatic. Moist oral mucosal membranes  ?Eyes: Anicteric  ?Lungs:  Clear to auscultation, normal effort, room air  ?Heart: Regular rate and rhythm  ?Abdomen:  Soft, nontender, nondistended  ?Extremities: No peripheral  edema.  ?Neurologic: Nonfocal, moving all four extremities  ?Skin: No lesions  ?Access: None  ? ? ? ?Lab results: ?Basic Metabolic Panel: ?Recent Labs  ?Lab 02/28/22 ?1056 03/01/22 ?0458  ?NA 139 135  ?K 3.8 3.8  ?CL 107 107  ?CO2 24 24  ?GLUCOSE 93 107*  ?BUN 26* 27*  ?CREATININE 2.30* 2.81*  ?CALCIUM 8.8* 8.4*  ? ? ?Liver Function Tests: ?Recent Labs  ?Lab 02/28/22 ?1056 03/01/22 ?0458  ?AST  28 21  ?ALT 20 14  ?ALKPHOS 57 47  ?BILITOT 1.6* 0.9  ?PROT 6.9 5.8*  ?ALBUMIN 4.3 3.6  ? ?No results for input(s): LIPASE, AMYLASE in the last 168 hours. ?No results for input(s): AMMONIA in the last 168 hours. ? ?CBC: ?Recent Labs  ?Lab 02/28/22 ?1056 03/01/22 ?0458  ?WBC 9.5 8.2  ?NEUTROABS 7.3  --   ?HGB 13.8 13.1  ?HCT 41.0 38.7*  ?MCV 89.9 91.7  ?PLT 178 153  ? ? ?Cardiac Enzymes: ?Recent Labs  ?Lab 02/28/22 ?1056 03/01/22 ?0458  ?CKTOTAL 163 113  ? ? ?BNP: ?Invalid input(s): POCBNP ? ?CBG: ?No results for input(s): GLUCAP in the last 168 hours. ? ?Microbiology: ?Results for orders placed or performed during the hospital encounter of 02/28/22  ?Group A Strep by PCR     Status: None  ? Collection Time: 03/01/22  2:31 AM  ? Specimen: Throat; Sterile Swab  ?Result Value Ref Range Status  ? Group A Strep by PCR NOT DETECTED NOT DETECTED Final  ?  Comment: Performed at [facility], [address], [place], [place] [address]  ?Chlamydia/NGC rt PCR ([facility] only)     Status: None  ? Collection Time: 03/01/22  2:31 AM  ? Specimen: Urine; GU  ?Result Value Ref Range Status  ? Specimen source GC/Chlam URINE, RANDOM  Final  ? Chlamydia Tr NOT DETECTED NOT DETECTED Final  ? N gonorrhoeae NOT DETECTED NOT DETECTED Final  ?  Comment: (NOTE) ?This CT/NG assay has not been evaluated in patients with a history of  ?hysterectomy. ?Performed at [facility], [address] [place], ?[place] [address] ?  ? ? ?Coagulation Studies: ?No results for input(s): LABPROT, INR in the last 72 hours. ? ?Urinalysis: ?Recent Labs  ?   02/28/22 ?1039  ?COLORURINE YELLOW*  ?LABSPEC 1.005  ?PHURINE 6.0  ?GLUCOSEU NEGATIVE  ?HGBUR MODERATE*  ?BILIRUBINUR NEGATIVE  ?KETONESUR NEGATIVE  ?PROTEINUR >=300*  ?NITRITE NEGATIVE  ?LEUKOCYTESUR

## 2022-03-01 NOTE — Progress Notes (Signed)
?PROGRESS NOTE ? ? ? ?Craig Peck  ZOX:[id number] DOB: [birth year] DOA: 02/28/2022 ?[facility]  ? ? ?Brief Narrative:  ?26 y.o. male with no significant past medical history who presents for bilateral flank pain. ?  ?Patient reports that starting yesterday he began to feel bilateral back pain that wraps around to his abdomen.  He described his pain as constant and sharp, worse with movement.  He works Engineer, structurallaying concrete, and has done so for the past 6 years.  He reports he always maintains good water intake, especially when he is working out in the sun for long periods.  He did take some ibuprofen and Tylenol yesterday but those were the first time he had for a while, he does not regularly take these medications.  He reports he does not work out, does not take any unusual supplements, does not eat any unusual foods.  He does notice some change in the color of his urine, since yesterday it has been a reddish color.  He has not noticed any rashes, has not been ill of late except for some worsening allergies, no sick contacts. ? ?Urinalysis with evidence of macro proteinuria.  Interestingly serum CK is normal on admission.  Patient continues to endorse bilateral lower back pain.  No radiation.  No paresthesias.  Minimal response to Flexeril and narcotics. ? ? ?Assessment & Plan: ?  ?Principal Problem: ?  AKI (acute kidney injury) (HCC) ?Active Problems: ?  Hyperbilirubinemia ? ?Acute kidney injury ?Unclear etiology ?Macro proteinuria noted in the urine ?SPEP/UPEP has been ordered ?Nephrology consultation requested ?Kidney function worsening over interval, creatinine 2.81 as of 4/23 ?Plan: ?Nephrology consulted, appreciate recommendations ?Lactated Ringer's 150 cc/h ?Follow SPEP/UPEP ?Follow-up peripheral smear ? ?Acute back pain ?Suspect musculoskeletal in origin ?No radiation, no saddle anesthesia ?Good range of motion though limited by pain ?Plan: ?Multimodal pain regimen ?No  NSAIDs ?Narcotics/muscle relaxants ? ?Hyperbilirubinemia ?Noncon CT with no abnormal liver or gallbladder findings ?Possibility of hemolysis but normal platelets ?We will follow-up peripheral smear ? ? ?DVT prophylaxis: SCD ?Code Status: Full ?Family Communication: Mother and brother at bedside 4/23 ?Disposition Plan: Status is: Inpatient ?Remains inpatient appropriate because: AKI of unclear etiology.  Pending nephrology evaluation ? ? ?Level of care: Med-Surg ? ?Consultants:  ?Nephrology ? ?Procedures:  ?None ? ?Antimicrobials: ?None ? ? ?Subjective: ?Patient seen and examined.  Continues to endorse bilateral back pain ? ?Objective: ?Vitals:  ? 02/28/22 1657 02/28/22 2136 03/01/22 0522 03/01/22 0734  ?BP: (!) 124/93 (!) 128/96 124/78 109/79  ?Pulse: (!) 54 (!) 53 63 (!) 59  ?Resp: 18 20 20 18   ?Temp: 98 ?F (36.7 ?C) 98.7 ?F (37.1 ?C) (!) 97.5 ?F (36.4 ?C) 98.7 ?F (37.1 ?C)  ?TempSrc: Oral Oral Oral Oral  ?SpO2: 100% 100% 92% 91%  ?Weight:      ?Height:      ? ? ?Intake/Output Summary (Last 24 hours) at 03/01/2022 1117 ?Last data filed at 03/01/2022 [id number] ?Gross per 24 hour  ?Intake 2186.76 ml  ?Output 600 ml  ?Net 1586.76 ml  ? ?Filed Weights  ? 02/28/22 1034  ?Weight: 62.6 kg  ? ? ?Examination: ? ?General exam: Appears calm and comfortable  ?Respiratory system: Clear to auscultation. Respiratory effort normal. ?Cardiovascular system: S1-S2, RRR, no murmurs, no pedal edema ?Gastrointestinal system: Soft, NT/ND, normal bowel sounds ?Central nervous system: Alert and oriented. No focal neurological deficits. ?Extremities: Decreased range of motion in and hips.  Ambulation not assessed ?Skin: No rashes, lesions or ulcers ?  Psychiatry: Judgement and insight appear normal. Mood & affect appropriate.  ? ? ? ?Data Reviewed: I have personally reviewed following labs and imaging studies ? ?CBC: ?Recent Labs  ?Lab 02/28/22 ?1056 03/01/22 ?0458  ?WBC 9.5 8.2  ?NEUTROABS 7.3  --   ?HGB 13.8 13.1  ?HCT 41.0 38.7*  ?MCV 89.9 91.7   ?PLT 178 153  ? ?Basic Metabolic Panel: ?Recent Labs  ?Lab 02/28/22 ?1056 03/01/22 ?0458  ?NA 139 135  ?K 3.8 3.8  ?CL 107 107  ?CO2 24 24  ?GLUCOSE 93 107*  ?BUN 26* 27*  ?CREATININE 2.30* 2.81*  ?CALCIUM 8.8* 8.4*  ? ?GFR: ?Estimated Creatinine Clearance: 36.2 mL/min (A) (by C-G formula based on SCr of 2.81 mg/dL (H)). ?Liver Function Tests: ?Recent Labs  ?Lab 02/28/22 ?1056 03/01/22 ?0458  ?AST 28 21  ?ALT 20 14  ?ALKPHOS 57 47  ?BILITOT 1.6* 0.9  ?PROT 6.9 5.8*  ?ALBUMIN 4.3 3.6  ? ?No results for input(s): LIPASE, AMYLASE in the last 168 hours. ?No results for input(s): AMMONIA in the last 168 hours. ?Coagulation Profile: ?No results for input(s): INR, PROTIME in the last 168 hours. ?Cardiac Enzymes: ?Recent Labs  ?Lab 02/28/22 ?1056  ?CKTOTAL 163  ? ?BNP (last 3 results) ?No results for input(s): PROBNP in the last 8760 hours. ?HbA1C: ?Recent Labs  ?  02/28/22 ?1456  ?HGBA1C 5.3  ? ?CBG: ?No results for input(s): GLUCAP in the last 168 hours. ?Lipid Profile: ?No results for input(s): CHOL, HDL, LDLCALC, TRIG, CHOLHDL, LDLDIRECT in the last 72 hours. ?Thyroid Function Tests: ?No results for input(s): TSH, T4TOTAL, FREET4, T3FREE, THYROIDAB in the last 72 hours. ?Anemia Panel: ?Recent Labs  ?  02/28/22 ?1456  ?RETICCTPCT 1.1  ? ?Sepsis Labs: ?No results for input(s): PROCALCITON, LATICACIDVEN in the last 168 hours. ? ?Recent Results (from the past 240 hour(s))  ?Group A Strep by PCR     Status: None  ? Collection Time: 03/01/22  2:31 AM  ? Specimen: Throat; Sterile Swab  ?Result Value Ref Range Status  ? Group A Strep by PCR NOT DETECTED NOT DETECTED Final  ?  Comment: Performed at [facility], [address] [place], [place] [address]  ?Chlamydia/NGC rt PCR ([facility] only)     Status: None  ? Collection Time: 03/01/22  2:31 AM  ? Specimen: Urine; GU  ?Result Value Ref Range Status  ? Specimen source GC/Chlam URINE, RANDOM  Final  ? Chlamydia Tr NOT DETECTED NOT DETECTED Final  ? N gonorrhoeae NOT  DETECTED NOT DETECTED Final  ?  Comment: (NOTE) ?This CT/NG assay has not been evaluated in patients with a history of  ?hysterectomy. ?Performed at [facility], [address] [place], ?[place] [address] ?  ?  ? ? ? ? ? ?Radiology Studies: ?CT Renal Stone Study ? ?Result Date: 02/28/2022 ?CLINICAL DATA:  26 year old male with history of mid back pain radiating to both sides, worsening with urination. Emesis. EXAM: CT ABDOMEN AND PELVIS WITHOUT CONTRAST TECHNIQUE: Multidetector CT imaging of the abdomen and pelvis was performed following the standard protocol without IV contrast. RADIATION DOSE REDUCTION: This exam was performed according to the departmental dose-optimization program which includes automated exposure control, adjustment of the mA and/or kV according to patient size and/or use of iterative reconstruction technique. COMPARISON:  CT of the abdomen and pelvis 12/12/2020. FINDINGS: Lower chest: Unremarkable. Hepatobiliary: No definite suspicious cystic or solid hepatic lesions are confidently identified on today's noncontrast CT examination. Unenhanced appearance of the gallbladder  is normal. Pancreas: No definite pancreatic mass or peripancreatic fluid collections or inflammatory changes are noted on today's noncontrast CT examination. Spleen: Unremarkable. Adrenals/Urinary Tract: There are no abnormal calcifications within the collecting system of either kidney, along the course of either ureter, or within the lumen of the urinary bladder. No hydroureteronephrosis or perinephric stranding to suggest urinary tract obstruction at this time. The unenhanced appearance of the kidneys is unremarkable bilaterally. Unenhanced appearance of the urinary bladder is normal. Bilateral adrenal glands are normal in appearance. Stomach/Bowel: Unenhanced appearance of the stomach is normal. There is no pathologic dilatation of small bowel or colon. Normal appendix. Vascular/Lymphatic: No atherosclerotic  calcifications are noted in the abdominal aorta or pelvic vasculature. No lymphadenopathy noted in the abdomen or pelvis. Reproductive: Prostate gland and seminal vesicles are unremarkable in appearance. Other: No si

## 2022-03-01 NOTE — TOC Initial Note (Addendum)
Transition of Care (TOC) - Initial/Assessment Note  ? ? ?Patient Details  ?Name: Craig Peck ?MRN: [id number] ?Date of Birth: 12-19-1995 ? ?Transition of Care (TOC) CM/SW Contact:    ?Joseph Art, LCSW ?Phone Number: [phone] ?03/01/2022, 11:39 AM ? ?Clinical Narrative:                 ? ?Patient presents due to bilateral side flank pain.  Plan for d/c is 24-48 hours pending nephrology consult and recommendations.  CSW spoke with patient and family and explained TOC role in patient care. CSW explained the discharge plan and gave the patient's mother the [facility] application.  CSW explained to patient and family what the process is for [facility] and [facility].  CSW update Attending on prescriptions sent [facility]. Main contact Craig Peck [phone]. ? ?Expected Discharge Plan: Home/Self Care ?Barriers to Discharge: Continued Medical Work up ? ? ?Patient Goals and CMS Choice ?  ?  ?  ? ?Expected Discharge Plan and Services ?Expected Discharge Plan: Home/Self Care ?In-house Referral: Clinical Social Work ?  ?  ?Living arrangements for the past 2 months: Single Family Home ?                ?  ?  ?  ?  ?  ?  ?  ?  ?  ?  ? ?Prior Living Arrangements/Services ?Living arrangements for the past 2 months: Single Family Home ?Lives with:: Parents, Minor Children ?Patient language and need for interpreter reviewed:: No (Patient speaks [place], mother does not) ?Do you feel safe going back to the place where you live?: Yes      ?Need for Family Participation in Patient Care: Yes (Comment) ?Care giver support system in place?: Yes (comment) ?  ?Criminal Activity/Legal Involvement Pertinent to Current Situation/Hospitalization: No - Comment as needed ? ?Activities of Daily Living ?Home Assistive Devices/Equipment: None ?ADL Screening (condition at time of admission) ?Patient's cognitive ability adequate to safely  complete daily activities?: Yes ?Is the patient deaf or have difficulty hearing?: No ?Does the patient have difficulty seeing, even when wearing glasses/contacts?: No ?Does the patient have difficulty concentrating, remembering, or making decisions?: No ?Patient able to express need for assistance with ADLs?: Yes ?Does the patient have difficulty dressing or bathing?: No ?Independently performs ADLs?: Yes (appropriate for developmental age) ?Does the patient have difficulty walking or climbing stairs?: No ?Weakness of Legs: None ?Weakness of Arms/Hands: None ? ?Permission Sought/Granted ?Permission sought to share information with : Family Supports ?Permission granted to share information with : Yes, Verbal Permission Granted ? Share Information with NAME: Chalmers Cater Mother     [phone] ?   ?   ?   ? ?Emotional Assessment ?Appearance:: Appears stated age ?Attitude/Demeanor/Rapport: Engaged ?Affect (typically observed): Stable ?Orientation: : Oriented to Situation, Oriented to  Time, Oriented to Place, Oriented to Self ?Alcohol / Substance Use: Alcohol Use ?Psych Involvement: No (comment) ? ?Admission diagnosis:  AKI (acute kidney injury) (HCC) [N17.9] ?Patient Active Problem List  ? Diagnosis Date Noted  ? AKI (acute kidney injury) (HCC) 02/28/2022  ? Hyperbilirubinemia 02/28/2022  ? ?PCP:  [facility] ?Pharmacy:   ?[id number] - [place], [place] - [address] ?[address] ?[place] [address] ?Phone: [phone] Fax: [phone] ? ? ? ? ?Social Determinants of Health (SDOH) Interventions ?  ? ?Readmission Risk Interventions ?   ? View : No data to  display.  ?  ?  ?  ? ? ? ?

## 2022-03-02 ENCOUNTER — Inpatient Hospital Stay: Payer: Medicaid Other

## 2022-03-02 LAB — CBC WITH DIFFERENTIAL/PLATELET
Abs Immature Granulocytes: 0.05 10*3/uL (ref 0.00–0.07)
Basophils Absolute: 0.1 10*3/uL (ref 0.0–0.1)
Basophils Relative: 0 %
Eosinophils Absolute: 0.1 10*3/uL (ref 0.0–0.5)
Eosinophils Relative: 1 %
HCT: 40.4 % (ref 39.0–52.0)
Hemoglobin: 13.9 g/dL (ref 13.0–17.0)
Immature Granulocytes: 0 %
Lymphocytes Relative: 7 %
Lymphs Abs: 0.8 10*3/uL (ref 0.7–4.0)
MCH: 30.9 pg (ref 26.0–34.0)
MCHC: 34.4 g/dL (ref 30.0–36.0)
MCV: 89.8 fL (ref 80.0–100.0)
Monocytes Absolute: 1 10*3/uL (ref 0.1–1.0)
Monocytes Relative: 9 %
Neutro Abs: 9.5 10*3/uL — ABNORMAL HIGH (ref 1.7–7.7)
Neutrophils Relative %: 83 %
Platelets: 165 10*3/uL (ref 150–400)
RBC: 4.5 MIL/uL (ref 4.22–5.81)
RDW: 11.3 % — ABNORMAL LOW (ref 11.5–15.5)
WBC: 11.5 10*3/uL — ABNORMAL HIGH (ref 4.0–10.5)
nRBC: 0 % (ref 0.0–0.2)

## 2022-03-02 LAB — BASIC METABOLIC PANEL
Anion gap: 7 (ref 5–15)
BUN: 29 mg/dL — ABNORMAL HIGH (ref 6–20)
CO2: 24 mmol/L (ref 22–32)
Calcium: 8.2 mg/dL — ABNORMAL LOW (ref 8.9–10.3)
Chloride: 108 mmol/L (ref 98–111)
Creatinine, Ser: 3.94 mg/dL — ABNORMAL HIGH (ref 0.61–1.24)
GFR, Estimated: 21 mL/min — ABNORMAL LOW (ref >60)
Glucose, Bld: 93 mg/dL (ref 70–99)
Potassium: 4 mmol/L (ref 3.5–5.1)
Sodium: 139 mmol/L (ref 135–145)

## 2022-03-02 LAB — ANA W/REFLEX: Anti Nuclear Antibody (ANA): NEGATIVE

## 2022-03-02 LAB — RENAL FUNCTION PANEL
Albumin: 3.4 g/dL — ABNORMAL LOW (ref 3.5–5.0)
Anion gap: 8 (ref 5–15)
BUN: 32 mg/dL — ABNORMAL HIGH (ref 6–20)
CO2: 24 mmol/L (ref 22–32)
Calcium: 8.1 mg/dL — ABNORMAL LOW (ref 8.9–10.3)
Chloride: 107 mmol/L (ref 98–111)
Creatinine, Ser: 4.17 mg/dL — ABNORMAL HIGH (ref 0.61–1.24)
GFR, Estimated: 20 mL/min — ABNORMAL LOW (ref >60)
Glucose, Bld: 101 mg/dL — ABNORMAL HIGH (ref 70–99)
Phosphorus: 4.8 mg/dL — ABNORMAL HIGH (ref 2.5–4.6)
Potassium: 3.5 mmol/L (ref 3.5–5.1)
Sodium: 139 mmol/L (ref 135–145)

## 2022-03-02 LAB — PROTIME-INR
INR: 1.1 (ref 0.8–1.2)
Prothrombin Time: 14.2 seconds (ref 11.4–15.2)

## 2022-03-02 LAB — CBC
HCT: 39.9 % (ref 39.0–52.0)
Hemoglobin: 13.7 g/dL (ref 13.0–17.0)
MCH: 30.3 pg (ref 26.0–34.0)
MCHC: 34.3 g/dL (ref 30.0–36.0)
MCV: 88.3 fL (ref 80.0–100.0)
Platelets: 180 10*3/uL (ref 150–400)
RBC: 4.52 MIL/uL (ref 4.22–5.81)
RDW: 11.4 % — ABNORMAL LOW (ref 11.5–15.5)
WBC: 8.9 10*3/uL (ref 4.0–10.5)
nRBC: 0 % (ref 0.0–0.2)

## 2022-03-02 LAB — ANCA PROFILE
Anti-MPO Antibodies: <0.2 units (ref 0.0–0.9)
Anti-PR3 Antibodies: <0.2 units (ref 0.0–0.9)
Atypical P-ANCA titer: <1:20 {titer}
C-ANCA: <1:20 {titer}
P-ANCA: <1:20 {titer}

## 2022-03-02 LAB — URINE CULTURE: Culture: NO GROWTH

## 2022-03-02 LAB — PATHOLOGIST SMEAR REVIEW

## 2022-03-02 MED ORDER — OXYCODONE-ACETAMINOPHEN 5-325 MG PO TABS
1.0000 | ORAL_TABLET | Freq: Once | ORAL | Status: AC
  Administered 2022-03-02: 1 via ORAL
  Filled 2022-03-02: qty 1

## 2022-03-02 MED ORDER — LORAZEPAM 2 MG/ML IJ SOLN
0.5000 mg | Freq: Once | INTRAMUSCULAR | Status: AC
  Administered 2022-03-02: 0.5 mg via INTRAVENOUS
  Filled 2022-03-02: qty 1

## 2022-03-02 MED ORDER — LIDOCAINE 5 % EX PTCH
1.0000 | MEDICATED_PATCH | CUTANEOUS | Status: DC
  Administered 2022-03-02 – 2022-03-05 (×4): 1 via TRANSDERMAL
  Filled 2022-03-02 (×4): qty 1

## 2022-03-02 NOTE — Progress Notes (Signed)
?[facility]  ?ROUNDING NOTE  ? ?Subjective:  ? ?Patient seen resting in bed ?No family at bedside ?Currently NPO for procedure ?Denies pain and discomfort ? ?Creatinine 3.94 ? ? ?Objective:  ?Vital signs in last 24 hours:  ?Temp:  [98.8 ?F (37.1 ?C)-99.1 ?F (37.3 ?C)] 99.1 ?F (37.3 ?C) (04/24 5400) ?Pulse Rate:  [61-64] 63 (04/24 0724) ?Resp:  [16-18] 16 (04/24 0724) ?BP: (117-135)/(69-95) 135/91 (04/24 1008) ?SpO2:  [98 %-100 %] 100 % (04/24 1008) ? ?Weight change:  ?Filed Weights  ? 02/28/22 1034  ?Weight: 62.6 kg  ? ? ?Intake/Output: ?I/O last 3 completed shifts: ?In: 2214.7 [I.V.:2214.7] ?Out: 600 [Urine:600] ?  ?Intake/Output this shift: ? No intake/output data recorded. ? ?Physical Exam: ?General: NAD  ?Head: Normocephalic, atraumatic. Moist oral mucosal membranes  ?Eyes: Anicteric  ?Lungs:  Clear to auscultation, normal effort  ?Heart: Regular rate and rhythm  ?Abdomen:  Soft, nontender  ?Extremities:  No peripheral edema.  ?Neurologic: Nonfocal, moving all four extremities  ?Skin: No lesions  ?Access: None  ? ? ?Basic Metabolic Panel: ?Recent Labs  ?Lab 02/28/22 ?1056 03/01/22 ?0458 03/02/22 ?0433  ?NA 139 135 139  ?K 3.8 3.8 4.0  ?CL 107 107 108  ?CO2 24 24 24   ?GLUCOSE 93 107* 93  ?BUN 26* 27* 29*  ?CREATININE 2.30* 2.81* 3.94*  ?CALCIUM 8.8* 8.4* 8.2*  ? ? ?Liver Function Tests: ?Recent Labs  ?Lab 02/28/22 ?1056 03/01/22 ?0458  ?AST 28 21  ?ALT 20 14  ?ALKPHOS 57 47  ?BILITOT 1.6* 0.9  ?PROT 6.9 5.8*  ?ALBUMIN 4.3 3.6  ? ?No results for input(s): LIPASE, AMYLASE in the last 168 hours. ?No results for input(s): AMMONIA in the last 168 hours. ? ?CBC: ?Recent Labs  ?Lab 02/28/22 ?1056 03/01/22 ?0458 03/02/22 ?0433  ?WBC 9.5 8.2 11.5*  ?NEUTROABS 7.3  --  9.5*  ?HGB 13.8 13.1 13.9  ?HCT 41.0 38.7* 40.4  ?MCV 89.9 91.7 89.8  ?PLT 178 153 165  ? ? ?Cardiac Enzymes: ?Recent Labs  ?Lab 02/28/22 ?1056 03/01/22 ?0458  ?CKTOTAL 163 113  ? ? ?BNP: ?Invalid input(s): POCBNP ? ?CBG: ?No results for  input(s): GLUCAP in the last 168 hours. ? ?Microbiology: ?Results for orders placed or performed during the hospital encounter of 02/28/22  ?Urine Culture     Status: None  ? Collection Time: 02/28/22 10:39 AM  ? Specimen: Urine, Clean Catch  ?Result Value Ref Range Status  ? Specimen Description   Final  ?  URINE, CLEAN CATCH ?Performed at [facility], [address], [place], [place] ?  ? Special Requests   Final  ?  NONE ?Performed at [facility], [address], [place], [place] ?  ? Culture   Final  ?  NO GROWTH ?Performed at [facility], [address], [place], [place] ?  ? Report Status 03/02/2022 FINAL  Final  ?Group A Strep by PCR     Status: None  ? Collection Time: 03/01/22  2:31 AM  ? Specimen: Throat; Sterile Swab  ?Result Value Ref Range Status  ? Group A Strep by PCR NOT DETECTED NOT DETECTED Final  ?  Comment: Performed at [facility], [address], [place], [place]  ?Chlamydia/NGC rt PCR ([facility] only)     Status: None  ? Collection Time: 03/01/22  2:31 AM  ? Specimen: Urine; GU  ?Result Value Ref Range Status  ? Specimen source GC/Chlam URINE, RANDOM  Final  ? Chlamydia Tr NOT DETECTED NOT DETECTED Final  ? N gonorrhoeae NOT DETECTED NOT DETECTED Final  ?  Comment: (NOTE) ?This CT/NG assay has not been evaluated in patients with a history of  ?hysterectomy. ?Performed at [facility], [address] [place], ?[place] [address] ?  ? ? ?Coagulation Studies: ?Recent Labs  ?  03/02/22 ?0433  ?LABPROT 14.2  ?INR 1.1  ? ? ?Urinalysis: ?Recent Labs  ?  02/28/22 ?1039  ?COLORURINE YELLOW*  ?LABSPEC 1.005  ?PHURINE 6.0  ?GLUCOSEU NEGATIVE  ?HGBUR MODERATE*  ?BILIRUBINUR NEGATIVE  ?KETONESUR NEGATIVE  ?PROTEINUR >=300*  ?NITRITE NEGATIVE  ?LEUKOCYTESUR NEGATIVE  ?  ? ? ?Imaging: ?US Biopsy-No Radiologist ? ?Result Date: 03/02/2022 ?INDICATION: Acute kidney injury and proteinuria. Left renal biopsy performed by Dr.  Cherylann Ratel. EXAM: Intra procedural ultrasound guidance MEDICATIONS: None. ANESTHESIA/SEDATION: Moderate (conscious) sedation was employed during this procedure. A total of Versed 0 mg and Fentanyl 0 mcg was administered intravenously by the radiology nurse. Total intra-service moderate Sedation Time: 0 minutes. The patient's level of consciousness and vital signs were monitored continuously by radiology nursing throughout the procedure under my direct supervision. COMPLICATIONS: None immediate. PROCEDURE: Informed written consent was obtained from the patient by Dr. Cherylann Ratel. Three core biopsies were performed of the posteroinferior left renal cortex. No significant perinephric hematoma on immediate postprocedural ultrasound. IMPRESSION: Three core biopsies were performed of the posteroinferior left renal cortex without immediate complications performed by Dr.Lateef. Electronically Signed   By: Elige Ko M.D.   On: 03/02/2022 10:24   ? ? ?Medications:  ? ? ? lidocaine  1 patch Transdermal Q24H  ? methocarbamol  750 mg Oral TID  ? sodium chloride flush  3 mL Intravenous Q12H  ? ?acetaminophen, diphenhydrAMINE, HYDROmorphone (DILAUDID) injection, ondansetron **OR** ondansetron (ZOFRAN) IV, oxyCODONE, polyethylene glycol, traZODone ? ?Assessment/ Plan:  ?Mr. Craig Peck is a 26 y.o.  male with no significant past medical history, who was admitted to [facility] on 02/28/2022 for AKI (acute kidney injury) (HCC) [N17.9] ? ? ?Acute kidney injury with proteinuria, nonnephrotic.  Baseline creatinine unknown.  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  Patient reportedly taking a number of supplements including injectable vitamins, male enhancement supplements, and vaping.  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  ?Renal function continues to decline. Urine output not recorded. Successful renal biopsy completed this morning with specimens sent ot [facility]  for analysis. Expect preliminary results in 24-48 hours, final in 5 days. Treatment plan will be determined from these results. Patient complains of abdominal pain post procedure, will order Ativan 0.5mg  IV once and oral Percocet 5-325mg  once.  ?Will continue to monitor renal function and urine output.  ? ?Lab Results  ?Component Value Date  ? CREATININE 3.94 (H) 03/02/2022  ? CREATININE 2.81 (H) 03/01/2022  ? CREATININE 2.30 (H) 02/28/2022  ? ? ?Intake/Output Summary (Last 24 hours) at 03/02/2022 1050 ?Last data filed at 03/01/2022 2101 ?Gross per 24 hour  ?Intake 1027.9 ml  ?Output --  ?Net 1027.9 ml  ? ? ? ? ? LOS: 2 ?Darby Shadwick ?4/24/202310:50 AM ?  ?

## 2022-03-02 NOTE — Plan of Care (Signed)

## 2022-03-02 NOTE — Procedures (Addendum)
After obtaining informed consent, the patient was brought down to the ultrasound suite. Subsequently the left kidney was identified under ultrasound. The left flank was prepped and draped in standard sterile fashion. Local anesthesia was achieved using 1% lidocaine. Subsequently using an 18-gauge biopsy device and ultraound guidance, a total of 3 passes were made into the left kidney. The specimens were submitted to pathology and deemed to be adequate for submission. No active bleeding noted on post-biopsy images.   The patient tolerated the procedure very well. He will return to his room for continued observation.   Estimated blood loss: None.  Complications: No immediate complications. 

## 2022-03-02 NOTE — Progress Notes (Signed)
?PROGRESS NOTE ? ? ? ?Craig Peck  BSW:[id number] DOB: [birth year] DOA: 02/28/2022 ?PCP: [facility]  ? ? ?Brief Narrative:  ?26 y.o. male with no significant past medical history who presents for bilateral flank pain. ?  ?Patient reports that starting yesterday he began to feel bilateral back pain that wraps around to his abdomen.  He described his pain as constant and sharp, worse with movement.  He works Engineer, structural, and has done so for the past 6 years.  He reports he always maintains good water intake, especially when he is working out in the sun for long periods.  He did take some ibuprofen and Tylenol yesterday but those were the first time he had for a while, he does not regularly take these medications.  He reports he does not work out, does not take any unusual supplements, does not eat any unusual foods.  He does notice some change in the color of his urine, since yesterday it has been a reddish color.  He has not noticed any rashes, has not been ill of late except for some worsening allergies, no sick contacts. ? ?Urinalysis with evidence of macro proteinuria.  Interestingly serum CK is normal on admission.  Patient continues to endorse bilateral lower back pain.  No radiation.  No paresthesias.  Minimal response to Flexeril and narcotics. ? ?4/24: Case discussed at length with nephrology.  Working diagnosis of intrinsic kidney injury secondary to multiple unknown supplements the patient may have taken.  Also possible contribution from vaping.  Kidney function continues to deteriorate.  Plan for image guided kidney biopsy with nephrology today.  Back pain felt to be unrelated to acute kidney injury.  Improving over interval. ? ? ?Assessment & Plan: ?  ?Principal Problem: ?  AKI (acute kidney injury) (HCC) ?Active Problems: ?  Hyperbilirubinemia ? ?Acute kidney injury ?Unclear etiology ?Macro proteinuria noted in the urine ?SPEP/UPEP has been ordered ?Nephrology  consultation requested ?Kidney function worsening over interval, creatinine 3.94 as of 4/24 ?Working diagnosis of intrinsic kidney injury secondary to multiple supplements and possibly vaping ?Plan: ?Nephrology consulted for kidney biopsy today ?Hold IV fluids ?Follow SPEP/UPEP ?Follow-up peripheral smear ?Kidney biopsy to be sent to pathology specialist at [facility] for official read ? ?Acute back pain ?Suspect musculoskeletal in origin ?No radiation, no saddle anesthesia ?Good range of motion though limited by pain ?Improving over interval ?Likely unrelated to kidney injury ?Plan: ?Multimodal pain regimen ?No NSAIDs ?Narcotics/muscle relaxants ?Lidoderm patch ? ?Hyperbilirubinemia ?Noncon CT with no abnormal liver or gallbladder findings ?Possibility of hemolysis but normal platelets ?We will follow-up peripheral smear ? ? ?DVT prophylaxis: SCD ?Code Status: Full ?Family Communication: Mother and brother at bedside 4/23, 4/24 ?Disposition Plan: Status is: Inpatient ?Remains inpatient appropriate because: AKI of unclear etiology.  Pending kidney biopsy and nephrology follow-up ? ? ?Level of care: Med-Surg ? ?Consultants:  ?Nephrology ? ?Procedures:  ?Kidney biopsy 4/24 ? ?Antimicrobials: ?None ? ? ?Subjective: ?Patient seen and examined.  Sleeping.  No visible distress.  Easily awakened.  Reports improvement in back pain.  N.p.o. for kidney biopsy today. ? ?Objective: ?Vitals:  ? 03/02/22 0438 03/02/22 0724 03/02/22 0935 03/02/22 1008  ?BP: 117/69 118/71 118/72 (!) 135/91  ?Pulse: 61 63    ?Resp: 16 16    ?Temp: 99.1 ?F (37.3 ?C) 99.1 ?F (37.3 ?C)    ?TempSrc:  Oral    ?SpO2: 100% 98% 100% 100%  ?Weight:      ?Height:      ? ? ?  Intake/Output Summary (Last 24 hours) at 03/02/2022 1026 ?Last data filed at 03/01/2022 2101 ?Gross per 24 hour  ?Intake 1027.9 ml  ?Output --  ?Net 1027.9 ml  ? ?Filed Weights  ? 02/28/22 1034  ?Weight: 62.6 kg  ? ? ?Examination: ? ?General exam: No acute distress ?Respiratory system: Clear to  auscultation. Respiratory effort normal. ?Cardiovascular system: S1-S2, RRR, no murmurs, no pedal edema ?Gastrointestinal system: Soft, NT/ND, normal bowel sounds ?Central nervous system: Alert and oriented. No focal neurological deficits. ?Extremities: Decreased range of motion in and hips.  Ambulation not assessed.  Tender to palpation lower back ?Skin: No rashes, lesions or ulcers ?Psychiatry: Judgement and insight appear normal. Mood & affect appropriate.  ? ? ? ?Data Reviewed: I have personally reviewed following labs and imaging studies ? ?CBC: ?Recent Labs  ?Lab 02/28/22 ?1056 03/01/22 ?0458 03/02/22 ?0433  ?WBC 9.5 8.2 11.5*  ?NEUTROABS 7.3  --  9.5*  ?HGB 13.8 13.1 13.9  ?HCT 41.0 38.7* 40.4  ?MCV 89.9 91.7 89.8  ?PLT 178 153 165  ? ?Basic Metabolic Panel: ?Recent Labs  ?Lab 02/28/22 ?1056 03/01/22 ?0458 03/02/22 ?0433  ?NA 139 135 139  ?K 3.8 3.8 4.0  ?CL 107 107 108  ?CO2 24 24 24   ?GLUCOSE 93 107* 93  ?BUN 26* 27* 29*  ?CREATININE 2.30* 2.81* 3.94*  ?CALCIUM 8.8* 8.4* 8.2*  ? ?GFR: ?Estimated Creatinine Clearance: 25.8 mL/min (A) (by C-G formula based on SCr of 3.94 mg/dL (H)). ?Liver Function Tests: ?Recent Labs  ?Lab 02/28/22 ?1056 03/01/22 ?0458  ?AST 28 21  ?ALT 20 14  ?ALKPHOS 57 47  ?BILITOT 1.6* 0.9  ?PROT 6.9 5.8*  ?ALBUMIN 4.3 3.6  ? ?No results for input(s): LIPASE, AMYLASE in the last 168 hours. ?No results for input(s): AMMONIA in the last 168 hours. ?Coagulation Profile: ?Recent Labs  ?Lab 03/02/22 ?0433  ?INR 1.1  ? ?Cardiac Enzymes: ?Recent Labs  ?Lab 02/28/22 ?1056 03/01/22 ?0458  ?CKTOTAL 163 113  ? ?BNP (last 3 results) ?No results for input(s): PROBNP in the last 8760 hours. ?HbA1C: ?Recent Labs  ?  02/28/22 ?1456  ?HGBA1C 5.3  ? ?CBG: ?No results for input(s): GLUCAP in the last 168 hours. ?Lipid Profile: ?No results for input(s): CHOL, HDL, LDLCALC, TRIG, CHOLHDL, LDLDIRECT in the last 72 hours. ?Thyroid Function Tests: ?No results for input(s): TSH, T4TOTAL, FREET4, T3FREE,  THYROIDAB in the last 72 hours. ?Anemia Panel: ?Recent Labs  ?  02/28/22 ?1456  ?RETICCTPCT 1.1  ? ?Sepsis Labs: ?No results for input(s): PROCALCITON, LATICACIDVEN in the last 168 hours. ? ?Recent Results (from the past 240 hour(s))  ?Urine Culture     Status: None  ? Collection Time: 02/28/22 10:39 AM  ? Specimen: Urine, Clean Catch  ?Result Value Ref Range Status  ? Specimen Description   Final  ?  URINE, CLEAN CATCH ?Performed at [facility], [address] [place], [place] [address] ?  ? Special Requests   Final  ?  NONE ?Performed at [facility], [address] [place], [place] [address] ?  ? Culture   Final  ?  NO GROWTH ?Performed at [facility], [address] [place], [place] [address] ?  ? Report Status 03/02/2022 FINAL  Final  ?Group A Strep by PCR     Status: None  ? Collection Time: 03/01/22  2:31 AM  ? Specimen: Throat; Sterile Swab  ?Result Value Ref Range Status  ? Group A Strep by PCR NOT DETECTED  NOT DETECTED Final  ?  Comment: Performed at [facility], [address] [place], [place] [address]  ?Chlamydia/NGC rt PCR ([facility] only)     Status: None  ? Collection Time: 03/01/22  2:31 AM  ? Specimen: Urine; GU  ?Result Value Ref Range Status  ? Specimen source GC/Chlam URINE, RANDOM  Final  ? Chlamydia Tr NOT DETECTED NOT DETECTED Final  ? N gonorrhoeae NOT DETECTED NOT DETECTED Final  ?  Comment: (NOTE) ?This CT/NG assay has not been evaluated in patients with a history of  ?hysterectomy. ?Performed at [facility], [address] [place], ?[place] [address] ?  ?  ? ? ? ? ? ?Radiology Studies: ?US Biopsy-No Radiologist ? ?Result Date: 03/02/2022 ?INDICATION: Acute kidney injury and proteinuria. Left renal biopsy performed by Dr. Cherylann Ratel. EXAM: Intra procedural ultrasound guidance MEDICATIONS: None. ANESTHESIA/SEDATION: Moderate (conscious) sedation was employed during this procedure. A total of Versed 0 mg and Fentanyl 0 mcg was administered  intravenously by the radiology nurse. Total intra-service moderate Sedation Time: 0 minutes. The patient's level of consciousness and vital signs were monitored continuously by radiology nursing throughout the procedure under

## 2022-03-03 LAB — BASIC METABOLIC PANEL
Anion gap: 6 (ref 5–15)
BUN: 33 mg/dL — ABNORMAL HIGH (ref 6–20)
CO2: 24 mmol/L (ref 22–32)
Calcium: 8.1 mg/dL — ABNORMAL LOW (ref 8.9–10.3)
Chloride: 105 mmol/L (ref 98–111)
Creatinine, Ser: 4.43 mg/dL — ABNORMAL HIGH (ref 0.61–1.24)
GFR, Estimated: 18 mL/min — ABNORMAL LOW (ref >60)
Glucose, Bld: 95 mg/dL (ref 70–99)
Potassium: 3.7 mmol/L (ref 3.5–5.1)
Sodium: 135 mmol/L (ref 135–145)

## 2022-03-03 LAB — CBC WITH DIFFERENTIAL/PLATELET
Abs Immature Granulocytes: 0.03 10*3/uL (ref 0.00–0.07)
Basophils Absolute: 0 10*3/uL (ref 0.0–0.1)
Basophils Relative: 0 %
Eosinophils Absolute: 0.2 10*3/uL (ref 0.0–0.5)
Eosinophils Relative: 2 %
HCT: 37.7 % — ABNORMAL LOW (ref 39.0–52.0)
Hemoglobin: 13 g/dL (ref 13.0–17.0)
Immature Granulocytes: 0 %
Lymphocytes Relative: 14 %
Lymphs Abs: 1.2 10*3/uL (ref 0.7–4.0)
MCH: 30.2 pg (ref 26.0–34.0)
MCHC: 34.5 g/dL (ref 30.0–36.0)
MCV: 87.5 fL (ref 80.0–100.0)
Monocytes Absolute: 1.1 10*3/uL — ABNORMAL HIGH (ref 0.1–1.0)
Monocytes Relative: 13 %
Neutro Abs: 6.1 10*3/uL (ref 1.7–7.7)
Neutrophils Relative %: 71 %
Platelets: 163 10*3/uL (ref 150–400)
RBC: 4.31 MIL/uL (ref 4.22–5.81)
RDW: 11.4 % — ABNORMAL LOW (ref 11.5–15.5)
WBC: 8.6 10*3/uL (ref 4.0–10.5)
nRBC: 0 % (ref 0.0–0.2)

## 2022-03-03 LAB — PROTEIN ELECTROPHORESIS, SERUM
A/G Ratio: 1.7 (ref 0.7–1.7)
Albumin ELP: 3.6 g/dL (ref 2.9–4.4)
Alpha-1-Globulin: 0.2 g/dL (ref 0.0–0.4)
Alpha-2-Globulin: 0.6 g/dL (ref 0.4–1.0)
Beta Globulin: 0.7 g/dL (ref 0.7–1.3)
Gamma Globulin: 0.5 g/dL (ref 0.4–1.8)
Globulin, Total: 2.1 g/dL — ABNORMAL LOW (ref 2.2–3.9)
Total Protein ELP: 5.7 g/dL — ABNORMAL LOW (ref 6.0–8.5)

## 2022-03-03 LAB — URINALYSIS, COMPLETE (UACMP) WITH MICROSCOPIC
Bacteria, UA: NONE SEEN
Bilirubin Urine: NEGATIVE
Glucose, UA: NEGATIVE mg/dL
Ketones, ur: NEGATIVE mg/dL
Leukocytes,Ua: NEGATIVE
Nitrite: NEGATIVE
Protein, ur: 30 mg/dL — AB
Specific Gravity, Urine: 1.003 — ABNORMAL LOW (ref 1.005–1.030)
Squamous Epithelial / HPF: NONE SEEN (ref 0–5)
pH: 6 (ref 5.0–8.0)

## 2022-03-03 LAB — PROTEIN / CREATININE RATIO, URINE
Creatinine, Urine: 55 mg/dL
Protein Creatinine Ratio: 0.38 mg/mg{Cre} — ABNORMAL HIGH (ref 0.00–0.15)
Total Protein, Urine: 21 mg/dL

## 2022-03-03 LAB — C3 COMPLEMENT: C3 Complement: 104 mg/dL (ref 82–167)

## 2022-03-03 LAB — C4 COMPLEMENT: Complement C4, Body Fluid: 22 mg/dL (ref 12–38)

## 2022-03-03 MED ORDER — HYDRALAZINE HCL 20 MG/ML IJ SOLN: 10.0000 mg | INTRAMUSCULAR | Status: DC | PRN

## 2022-03-03 MED ORDER — METHOCARBAMOL 500 MG PO TABS
750.0000 mg | ORAL_TABLET | Freq: Four times a day (QID) | ORAL | Status: DC
  Administered 2022-03-03 – 2022-03-05 (×9): 750 mg via ORAL
  Filled 2022-03-03 (×8): qty 2

## 2022-03-03 NOTE — Plan of Care (Signed)
Patient AAOx4, severe, intermittent flank pain. VS are stable. Plan to transfer to [facility] when bed is available. Bed is in lowest position, call light within reach. Will continue to monitor. ?

## 2022-03-03 NOTE — Progress Notes (Signed)
?PROGRESS NOTE ? ? ? ?Craig Peck  WUJ:[id number] DOB: 1996-11-05 DOA: 02/28/2022 ?[facility]  ? ? ?Brief Narrative:  ?26 y.o. male with no significant past medical history who presents for bilateral flank pain. ?  ?Patient reports that starting yesterday he began to feel bilateral back pain that wraps around to his abdomen.  He described his pain as constant and sharp, worse with movement.  He works Engineer, structurallaying concrete, and has done so for the past 6 years.  He reports he always maintains good water intake, especially when he is working out in the sun for long periods.  He did take some ibuprofen and Tylenol yesterday but those were the first time he had for a while, he does not regularly take these medications.  He reports he does not work out, does not take any unusual supplements, does not eat any unusual foods.  He does notice some change in the color of his urine, since yesterday it has been a reddish color.  He has not noticed any rashes, has not been ill of late except for some worsening allergies, no sick contacts. ? ?Urinalysis with evidence of macro proteinuria.  Interestingly serum CK is normal on admission.  Patient continues to endorse bilateral lower back pain.  No radiation.  No paresthesias.  Minimal response to Flexeril and narcotics. ? ?4/24: Case discussed at length with nephrology.  Working diagnosis of intrinsic kidney injury secondary to multiple unknown supplements the patient may have taken.  Also possible contribution from vaping.  Kidney function continues to deteriorate.  Plan for image guided kidney biopsy with nephrology today.  Back pain felt to be unrelated to acute kidney injury.  Improving over interval. ? ?4/25: Kidney function continues to deteriorate.  Initial biopsy results negative for ANCA.  Still unclear diagnosis.  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 ? ?Family has been expressing some frustration  about perceived delays in care.  Repeated discussions with myself as well as the nephrology consultant.  [facility].  I attempted to call transfer line this morning.  Currently [facility] is close to all outside medicine transfers.  Transfer center recommended we call back tomorrow if transfer still requested. ? ? ?Assessment & Plan: ?  ?Principal Problem: ?  AKI (acute kidney injury) (HCC) ?Active Problems: ?  Hyperbilirubinemia ? ?Acute kidney injury ?Unclear etiology ?Macro proteinuria noted in the urine ?SPEP/UPEP has been ordered ?Nephrology consultation requested ?Kidney function worsening over interval, creatinine 4.34 as of 4/25 ?Working diagnosis of intrinsic kidney injury secondary to multiple supplements and possibly vaping ?Plan: ?Daily renal function ?Follow-up final biopsy results ?Daily renal function ?Follow further nephrology recommendations ?[facility] 4/26 if transfer is still requested ? ?Acute back pain ?Suspect musculoskeletal in origin ?No radiation, no saddle anesthesia ?Good range of motion though limited by pain ?Improving over interval ?Likely unrelated to kidney injury ?Plan: ?Multimodal pain regimen ?No NSAIDs ?Narcotics/muscle relaxants ?Lidoderm patch ? ?Hyperbilirubinemia ?Noncon CT with no abnormal liver or gallbladder findings ?Possibility of hemolysis but normal platelets ?We will follow-up peripheral smear ? ? ?DVT prophylaxis: SCD ?Code Status: Full ?Family Communication: Mother and brother at bedside 4/23, 4/24, 4/25 ?Disposition Plan: Status is: Inpatient ?Remains inpatient appropriate because: AKI of unclear etiology.  Pending kidney biopsy and nephrology follow-up ? ? ?Level of care: Med-Surg ? ?Consultants:  ?Nephrology ? ?Procedures:  ?Kidney biopsy 4/24 ? ?Antimicrobials: ?None ? ? ?Subjective: ?Patient seen and  examined.  No visible distress.  Expresses frustration about deteriorating kidney function. ? ?Objective: ?Vitals:   ? 03/02/22 2002 03/03/22 0419 03/03/22 0902 03/03/22 1024  ?BP: (!) 130/93 (!) 131/91 (!) 141/101 (!) 132/95  ?Pulse: 72 60 (!) 59 64  ?Resp: 20 18 20 18   ?Temp: 99.4 ?F (37.4 ?C) 99.1 ?F (37.3 ?C) 98.6 ?F (37 ?C)   ?TempSrc:  Oral Oral   ?SpO2: 100% 99% 100% 100%  ?Weight:      ?Height:      ? ? ?Intake/Output Summary (Last 24 hours) at 03/03/2022 1319 ?Last data filed at 03/03/2022 1000 ?Gross per 24 hour  ?Intake 0 ml  ?Output 1915 ml  ?Net -1915 ml  ? ?Filed Weights  ? 02/28/22 1034  ?Weight: 62.6 kg  ? ? ?Examination: ? ?General exam: NAD ?Respiratory system: Clear to auscultation. Respiratory effort normal. ?Cardiovascular system: S1-S2, RRR, no murmurs, no pedal edema ?Gastrointestinal system: Soft, NT/ND, normal bowel sounds ?Central nervous system: Alert and oriented. No focal neurological deficits. ?Extremities: Tender to palpation lower back.  Good range of motion ?Skin: No rashes, lesions or ulcers ?Psychiatry: Judgement and insight appear normal. Mood & affect appropriate.  ? ? ? ?Data Reviewed: I have personally reviewed following labs and imaging studies ? ?CBC: ?Recent Labs  ?Lab 02/28/22 ?1056 03/01/22 ?0458 03/02/22 ?0433 03/02/22 ?1601 03/03/22 ?0331  ?WBC 9.5 8.2 11.5* 8.9 8.6  ?NEUTROABS 7.3  --  9.5*  --  6.1  ?HGB 13.8 13.1 13.9 13.7 13.0  ?HCT 41.0 38.7* 40.4 39.9 37.7*  ?MCV 89.9 91.7 89.8 88.3 87.5  ?PLT 178 153 165 180 163  ? ?Basic Metabolic Panel: ?Recent Labs  ?Lab 02/28/22 ?1056 03/01/22 ?0458 03/02/22 ?0433 03/02/22 ?1601 03/03/22 ?0331  ?NA 139 135 139 139 135  ?K 3.8 3.8 4.0 3.5 3.7  ?CL 107 107 108 107 105  ?CO2 24 24 24 24 24   ?GLUCOSE 93 107* 93 101* 95  ?BUN 26* 27* 29* 32* 33*  ?CREATININE 2.30* 2.81* 3.94* 4.17* 4.43*  ?CALCIUM 8.8* 8.4* 8.2* 8.1* 8.1*  ?PHOS  --   --   --  4.8*  --   ? ?GFR: ?Estimated Creatinine Clearance: 23 mL/min (A) (by C-G formula based on SCr of 4.43 mg/dL (H)). ?Liver Function Tests: ?Recent Labs  ?Lab 02/28/22 ?1056 03/01/22 ?0458 03/02/22 ?1601  ?AST  28 21  --   ?ALT 20 14  --   ?ALKPHOS 57 47  --   ?BILITOT 1.6* 0.9  --   ?PROT 6.9 5.8*  --   ?ALBUMIN 4.3 3.6 3.4*  ? ?No results for input(s): LIPASE, AMYLASE in the last 168 hours. ?No results for input(s): AMMONIA in the last 168 hours. ?Coagulation Profile: ?Recent Labs  ?Lab 03/02/22 ?0433  ?INR 1.1  ? ?Cardiac Enzymes: ?Recent Labs  ?Lab 02/28/22 ?1056 03/01/22 ?0458  ?CKTOTAL 163 113  ? ?BNP (last 3 results) ?No results for input(s): PROBNP in the last 8760 hours. ?HbA1C: ?Recent Labs  ?  02/28/22 ?1456  ?HGBA1C 5.3  ? ?CBG: ?No results for input(s): GLUCAP in the last 168 hours. ?Lipid Profile: ?No results for input(s): CHOL, HDL, LDLCALC, TRIG, CHOLHDL, LDLDIRECT in the last 72 hours. ?Thyroid Function Tests: ?No results for input(s): TSH, T4TOTAL, FREET4, T3FREE, THYROIDAB in the last 72 hours. ?Anemia Panel: ?Recent Labs  ?  02/28/22 ?1456  ?RETICCTPCT 1.1  ? ?Sepsis Labs: ?No results for input(s): PROCALCITON, LATICACIDVEN in the last 168 hours. ? ?Recent Results (from the past 240  hour(s))  ?Urine Culture     Status: None  ? Collection Time: 02/28/22 10:39 AM  ? Specimen: Urine, Random  ?Result Value Ref Range Status  ? Specimen Description   Final  ?  URINE, CLEAN CATCH ?Performed at [facility], [address], [place], [place] [address] ?  ? Special Requests   Final  ?  NONE ?Performed at [facility], [address], [place], [place] [address] ?  ? Culture   Final  ?  NO GROWTH ?Performed at [facility], [address] [place], [place] [address] ?  ? Report Status 03/02/2022 FINAL  Final  ?Group A Strep by PCR     Status: None  ? Collection Time: 03/01/22  2:31 AM  ? Specimen: Throat; Sterile Swab  ?Result Value Ref Range Status  ? Group A Strep by PCR NOT DETECTED NOT DETECTED Final  ?  Comment: Performed at [facility], [address] [place], [place] [address]  ?Chlamydia/NGC rt PCR ([facility] only)     Status: None  ? Collection Time: 03/01/22  2:31 AM  ?  Specimen: Urine; GU  ?Result Value Ref Range Status  ? Specimen source GC/Chlam URINE, RANDOM  Final  ? Chlamydia Tr NOT DETECTED NOT DETECTED Final  ? N gonorrhoeae NOT DETECTED NOT DETECTED Final  ?  Comm

## 2022-03-03 NOTE — Progress Notes (Signed)
Mobility Specialist - Progress Note ? ? 03/03/22 1600  ?Mobility  ?Activity Ambulated independently in hallway  ?Level of Assistance Independent  ?Assistive Device None  ?Distance Ambulated (ft) 200 ft  ?Activity Response Tolerated well  ?$Mobility charge 1 Mobility  ? ? ? ?Pt ambulated hallway independently, no complaints. Family at bedside.  ? ?Filiberto Pinks ?Mobility Specialist ?03/03/22, 4:35 PM ? ? ? ? ?

## 2022-03-03 NOTE — Progress Notes (Addendum)
?[facility]  ?ROUNDING NOTE  ? ?Subjective:  ? ?Patient resting quietly with mother at bedside ?Complains of abdominal and right back pain ?Alert and oriented ?No lower extremity ?Reports adequate urine output ? ?Creatinine 4.4 ?UOP 1.115L overnight  ? ?Objective:  ?Vital signs in last 24 hours:  ?Temp:  [98.2 ?F (36.8 ?C)-99.4 ?F (37.4 ?C)] 98.6 ?F (37 ?C) (04/25 3154) ?Pulse Rate:  [59-72] 64 (04/25 1024) ?Resp:  [16-20] 18 (04/25 1024) ?BP: (130-141)/(91-101) 132/95 (04/25 1024) ?SpO2:  [99 %-100 %] 100 % (04/25 1024) ? ?Weight change:  ?Filed Weights  ? 02/28/22 1034  ?Weight: 62.6 kg  ? ? ?Intake/Output: ?I/O last 3 completed shifts: ?In: 3 [I.V.:3] ?Out: 1115 [Urine:1115] ?  ?Intake/Output this shift: ? No intake/output data recorded. ? ?Physical Exam: ?General: NAD  ?Head: Normocephalic, atraumatic. Moist oral mucosal membranes  ?Eyes: Anicteric  ?Lungs:  Clear to auscultation, normal effort  ?Heart: Regular rate and rhythm  ?Abdomen:  Soft, nontender  ?Extremities:  No peripheral edema.  ?Neurologic: Nonfocal, moving all four extremities  ?Skin: No lesions  ?Access: None  ? ? ?Basic Metabolic Panel: ?Recent Labs  ?Lab 02/28/22 ?1056 03/01/22 ?0458 03/02/22 ?0433 03/02/22 ?1601 03/03/22 ?0331  ?NA 139 135 139 139 135  ?K 3.8 3.8 4.0 3.5 3.7  ?CL 107 107 108 107 105  ?CO2 24 24 24 24 24   ?GLUCOSE 93 107* 93 101* 95  ?BUN 26* 27* 29* 32* 33*  ?CREATININE 2.30* 2.81* 3.94* 4.17* 4.43*  ?CALCIUM 8.8* 8.4* 8.2* 8.1* 8.1*  ?PHOS  --   --   --  4.8*  --   ? ? ? ?Liver Function Tests: ?Recent Labs  ?Lab 02/28/22 ?1056 03/01/22 ?0458 03/02/22 ?1601  ?AST 28 21  --   ?ALT 20 14  --   ?ALKPHOS 57 47  --   ?BILITOT 1.6* 0.9  --   ?PROT 6.9 5.8*  --   ?ALBUMIN 4.3 3.6 3.4*  ? ? ?No results for input(s): LIPASE, AMYLASE in the last 168 hours. ?No results for input(s): AMMONIA in the last 168 hours. ? ?CBC: ?Recent Labs  ?Lab 02/28/22 ?1056 03/01/22 ?0458 03/02/22 ?0433 03/02/22 ?1601 03/03/22 ?0331  ?WBC 9.5  8.2 11.5* 8.9 8.6  ?NEUTROABS 7.3  --  9.5*  --  6.1  ?HGB 13.8 13.1 13.9 13.7 13.0  ?HCT 41.0 38.7* 40.4 39.9 37.7*  ?MCV 89.9 91.7 89.8 88.3 87.5  ?PLT 178 153 165 180 163  ? ? ? ?Cardiac Enzymes: ?Recent Labs  ?Lab 02/28/22 ?1056 03/01/22 ?0458  ?CKTOTAL 163 113  ? ? ? ?BNP: ?Invalid input(s): POCBNP ? ?CBG: ?No results for input(s): GLUCAP in the last 168 hours. ? ?Microbiology: ?Results for orders placed or performed during the hospital encounter of 02/28/22  ?Urine Culture     Status: None  ? Collection Time: 02/28/22 10:39 AM  ? Specimen: Urine, Random  ?Result Value Ref Range Status  ? Specimen Description   Final  ?  URINE, CLEAN CATCH ?Performed at [facility], [address] [place], [place] ?  ? Special Requests   Final  ?  NONE ?Performed at [facility], [address] [place], [place] ?  ? Culture   Final  ?  NO GROWTH ?Performed at [facility], [address], [place], [place] ?  ? Report Status 03/02/2022 FINAL  Final  ?Group A Strep by PCR     Status: None  ? Collection  Time: 03/01/22  2:31 AM  ? Specimen: Throat; Sterile Swab  ?Result Value Ref Range Status  ? Group A Strep by PCR NOT DETECTED NOT DETECTED Final  ?  Comment: Performed at [facility], [address] [place], [place] [address]  ?Chlamydia/NGC rt PCR ([facility] only)     Status: None  ? Collection Time: 03/01/22  2:31 AM  ? Specimen: Urine; GU  ?Result Value Ref Range Status  ? Specimen source GC/Chlam URINE, RANDOM  Final  ? Chlamydia Tr NOT DETECTED NOT DETECTED Final  ? N gonorrhoeae NOT DETECTED NOT DETECTED Final  ?  Comment: (NOTE) ?This CT/NG assay has not been evaluated in patients with a history of  ?hysterectomy. ?Performed at [facility], [address] [place], ?[place] [address] ?  ? ? ?Coagulation Studies: ?Recent Labs  ?  03/02/22 ?0433  ?LABPROT 14.2  ?INR 1.1  ? ? ? ?Urinalysis: ?No results for input(s): COLORURINE, LABSPEC, PHURINE,  GLUCOSEU, HGBUR, BILIRUBINUR, KETONESUR, PROTEINUR, UROBILINOGEN, NITRITE, LEUKOCYTESUR in the last 72 hours. ? ?Invalid input(s): APPERANCEUR ?  ? ? ?Imaging: ?US Biopsy-No Radiologist ? ?Result Date: 03/02/2022 ?INDICATION: Acute kidney injury and proteinuria. Left renal biopsy performed by Dr. Cherylann Ratel. EXAM: Intra procedural ultrasound guidance MEDICATIONS: None. ANESTHESIA/SEDATION: Moderate (conscious) sedation was employed during this procedure. A total of Versed 0 mg and Fentanyl 0 mcg was administered intravenously by the radiology nurse. Total intra-service moderate Sedation Time: 0 minutes. The patient's level of consciousness and vital signs were monitored continuously by radiology nursing throughout the procedure under my direct supervision. COMPLICATIONS: None immediate. PROCEDURE: Informed written consent was obtained from the patient by Dr. Cherylann Ratel. Three core biopsies were performed of the posteroinferior left renal cortex. No significant perinephric hematoma on immediate postprocedural ultrasound. IMPRESSION: Three core biopsies were performed of the posteroinferior left renal cortex without immediate complications performed by Dr.Lateef. Electronically Signed   By: Elige Ko M.D.   On: 03/02/2022 10:24   ? ? ?Medications:  ? ? ? lidocaine  1 patch Transdermal Q24H  ? methocarbamol  750 mg Oral QID  ? sodium chloride flush  3 mL Intravenous Q12H  ? ?acetaminophen, diphenhydrAMINE, hydrALAZINE, HYDROmorphone (DILAUDID) injection, ondansetron **OR** ondansetron (ZOFRAN) IV, oxyCODONE, polyethylene glycol, traZODone ? ?Assessment/ Plan:  ?Mr. Khylin Gutridge is a 26 y.o.  male with no significant past medical history, who was admitted to [facility] on 02/28/2022 for AKI (acute kidney injury) (HCC) [N17.9] ? ? ?Acute kidney injury with proteinuria, nonnephrotic.  Baseline creatinine unknown.  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  Patient reportedly taking a number of  supplements including injectable vitamins, male enhancement supplements, and vaping.  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  ?Creatinine continues to increase. Adequate urine output recorded.  ?Renal biopsy completed yesterday with sample sent to [facility] for analysis. Preliminary results expected today or tomorrow with final results in 5 days.  ?Discussed all the treatment options, including temporary dialysis.  ?Patient and mother requested transfer to [facility] yesterday. Discussed with patient and mother that we can attempt a hospital transfer but unsure if it will be accepted due to the treatments provided and awaiting results. Will continue to follow ? ?Lab Results  ?Component Value Date  ? CREATININE 4.43 (H) 03/03/2022  ? CREATININE 4.17 (H) 03/02/2022  ? CREATININE 3.94 (H) 03/02/2022  ? ? ?Intake/Output Summary (Last 24 hours) at 03/03/2022 1043 ?Last data filed at 03/03/2022 3159 ?Michaell Cowing  per 24 hour  ?Intake 0 ml  ?Output 1115 ml  ?Net -1115 ml  ? ? ? ? ? ? LOS: 3 ?Maximillian Habibi ?4/25/202310:43 AM ?  ?

## 2022-03-04 DIAGNOSIS — M549 Dorsalgia, unspecified: Secondary | ICD-10-CM

## 2022-03-04 DIAGNOSIS — L299 Pruritus, unspecified: Secondary | ICD-10-CM

## 2022-03-04 DIAGNOSIS — M545 Low back pain, unspecified: Secondary | ICD-10-CM

## 2022-03-04 LAB — CBC WITH DIFFERENTIAL/PLATELET
Abs Immature Granulocytes: 0.03 10*3/uL (ref 0.00–0.07)
Basophils Absolute: 0.1 10*3/uL (ref 0.0–0.1)
Basophils Relative: 1 %
Eosinophils Absolute: 0.2 10*3/uL (ref 0.0–0.5)
Eosinophils Relative: 2 %
HCT: 39.2 % (ref 39.0–52.0)
Hemoglobin: 13.8 g/dL (ref 13.0–17.0)
Immature Granulocytes: 0 %
Lymphocytes Relative: 9 %
Lymphs Abs: 0.7 10*3/uL (ref 0.7–4.0)
MCH: 30.8 pg (ref 26.0–34.0)
MCHC: 35.2 g/dL (ref 30.0–36.0)
MCV: 87.5 fL (ref 80.0–100.0)
Monocytes Absolute: 0.9 10*3/uL (ref 0.1–1.0)
Monocytes Relative: 13 %
Neutro Abs: 5.7 10*3/uL (ref 1.7–7.7)
Neutrophils Relative %: 75 %
Platelets: 166 10*3/uL (ref 150–400)
RBC: 4.48 MIL/uL (ref 4.22–5.81)
RDW: 11.3 % — ABNORMAL LOW (ref 11.5–15.5)
WBC: 7.5 10*3/uL (ref 4.0–10.5)
nRBC: 0 % (ref 0.0–0.2)

## 2022-03-04 LAB — BASIC METABOLIC PANEL
Anion gap: 7 (ref 5–15)
BUN: 28 mg/dL — ABNORMAL HIGH (ref 6–20)
CO2: 26 mmol/L (ref 22–32)
Calcium: 8.5 mg/dL — ABNORMAL LOW (ref 8.9–10.3)
Chloride: 106 mmol/L (ref 98–111)
Creatinine, Ser: 4.56 mg/dL — ABNORMAL HIGH (ref 0.61–1.24)
GFR, Estimated: 18 mL/min — ABNORMAL LOW (ref >60)
Glucose, Bld: 107 mg/dL — ABNORMAL HIGH (ref 70–99)
Potassium: 4 mmol/L (ref 3.5–5.1)
Sodium: 139 mmol/L (ref 135–145)

## 2022-03-04 LAB — COMPLEMENT, TOTAL: Compl, Total (CH50): 58 U/mL (ref >41)

## 2022-03-04 MED ORDER — DIPHENHYDRAMINE HCL 25 MG PO CAPS: 25.0000 mg | ORAL_CAPSULE | Freq: Four times a day (QID) | ORAL | Status: DC | PRN

## 2022-03-04 MED ORDER — POLYETHYLENE GLYCOL 3350 17 G PO PACK
17.0000 g | PACK | Freq: Every day | ORAL | Status: DC
  Administered 2022-03-04: 17 g via ORAL
  Filled 2022-03-04: qty 1

## 2022-03-04 NOTE — Assessment & Plan Note (Signed)
As needed Benadryl.  Continue to taper off pain medications. ?

## 2022-03-04 NOTE — Progress Notes (Signed)
?  Progress Note ? ? ?Patient: Craig Peck [id number] DOB: 1996-02-17 DOA: 02/28/2022     4 ?DOS: the patient was seen and examined on 03/04/2022 ?  ? ?Assessment and Plan: ?* AKI (acute kidney injury) ([place]) ?Creatinine continue to rise but less increased than previous.  Creatinine 4.56 today.  Kidney biopsy results still pending.  I explained the kidneys are quick to impair and slow to improve. ? ?Back pain ?Likely musculoskeletal nature.  Unable to give NSAIDs with kidney function.  Careful with pain medications. ? ?Itching ?As needed Benadryl.  Continue to taper off pain medications. ? ?Hyperbilirubinemia ?Bilirubin in normal range.  This has resolved. ? ? ? ? ?  ? ?Subjective: Patient feeling a little bit better today.  Offers no complaints.  Eating a little bit better.  Little less pain.  Having itching from the pain medications.  Advised to go over to Tylenol. ? ?Physical Exam: ?Vitals:  ? 03/03/22 1557 03/03/22 1954 03/04/22 0455 03/04/22 0741  ?BP: (!) 129/92 (!) 133/94 124/62 119/70  ?Pulse: 65 68 72 62  ?Resp: 20 18 16 14   ?Temp: 99.4 ?F (37.4 ?C) 99 ?F (37.2 ?C) 99 ?F (37.2 ?C) 98.5 ?F (36.9 ?C)  ?TempSrc: Oral  Oral Oral  ?SpO2: 100% 100% 100% 99%  ?Weight:      ?Height:      ? ?Physical Exam ?HENT:  ?   Head: Normocephalic.  ?   Mouth/Throat:  ?   Pharynx: No oropharyngeal exudate.  ?Eyes:  ?   General: Lids are normal.  ?   Conjunctiva/sclera: Conjunctivae normal.  ?Cardiovascular:  ?   Rate and Rhythm: Normal rate and regular rhythm.  ?   Heart sounds: Normal heart sounds, S1 normal and S2 normal.  ?Pulmonary:  ?   Breath sounds: Normal breath sounds. No decreased breath sounds, wheezing, rhonchi or rales.  ?Abdominal:  ?   Palpations: Abdomen is soft.  ?   Tenderness: There is no abdominal tenderness.  ?Musculoskeletal:  ?   Right lower leg: No swelling.  ?   Left lower leg: No swelling.  ?Skin: ?   General: Skin is warm.  ?   Findings: No rash.  ?Neurological:  ?   Mental Status: He is  alert and oriented to person, place, and time.  ?  ?Data Reviewed: ?Today's creatinine 4.56 with a GFR of 18.  Yesterday 4.43. ? ?Family Communication: Family at bedside ? ?Disposition: ?Status is: Inpatient ?Remains inpatient appropriate because: Creatinine will need to peak and start to trend better prior to disposition.  Would also like to have kidney biopsy results back. ? ?Planned Discharge Destination: Home ? ?Author: ?Loletha Grayer, MD ?03/04/2022 3:09 PM ? ?For on call review [email or web address].  ?

## 2022-03-04 NOTE — Plan of Care (Signed)

## 2022-03-04 NOTE — Progress Notes (Signed)
?[facility]  ?ROUNDING NOTE  ? ?Subjective:  ? ?Patient seen sitting at side of bed, eating breakfast ?Family at bedside including mother and brother. ?Patient appears in good spirits formed no results from renal biopsy. ?Requesting mother bring meals from home. ? ?Creatinine 4.56 ?Urine output 2.7 L in preceding 24 hours ? ?Objective:  ?Vital signs in last 24 hours:  ?Temp:  [98.5 ?F (36.9 ?C)-99.4 ?F (37.4 ?C)] 98.5 ?F (36.9 ?C) (04/26 0741) ?Pulse Rate:  [62-72] 62 (04/26 0741) ?Resp:  [14-20] 14 (04/26 0741) ?BP: (119-133)/(62-94) 119/70 (04/26 0741) ?SpO2:  [99 %-100 %] 99 % (04/26 0741) ? ?Weight change:  ?Filed Weights  ? 02/28/22 1034  ?Weight: 62.6 kg  ? ? ?Intake/Output: ?I/O last 3 completed shifts: ?In: 240 [P.O.:240] ?Out: 3815 [Urine:3815] ?  ?Intake/Output this shift: ? Total I/O ?In: 240 [P.O.:240] ?Out: 600 [Urine:600] ? ?Physical Exam: ?General: NAD, sitting at bedside  ?Head: Normocephalic, atraumatic. Moist oral mucosal membranes  ?Eyes: Anicteric  ?Lungs:  Clear to auscultation, normal effort  ?Heart: Regular rate and rhythm  ?Abdomen:  Soft, nontender  ?Extremities:  No peripheral edema.  ?Neurologic: Nonfocal, moving all four extremities  ?Skin: No lesions  ?Access: None  ? ? ?Basic Metabolic Panel: ?Recent Labs  ?Lab 03/01/22 ?0458 03/02/22 ?0433 03/02/22 ?1601 03/03/22 ?0331 03/04/22 ?0404  ?NA 135 139 139 135 139  ?K 3.8 4.0 3.5 3.7 4.0  ?CL 107 108 107 105 106  ?CO2 24 24 24 24 26   ?GLUCOSE 107* 93 101* 95 107*  ?BUN 27* 29* 32* 33* 28*  ?CREATININE 2.81* 3.94* 4.17* 4.43* 4.56*  ?CALCIUM 8.4* 8.2* 8.1* 8.1* 8.5*  ?PHOS  --   --  4.8*  --   --   ? ? ? ?Liver Function Tests: ?Recent Labs  ?Lab 02/28/22 ?1056 03/01/22 ?0458 03/02/22 ?1601  ?AST 28 21  --   ?ALT 20 14  --   ?ALKPHOS 57 47  --   ?BILITOT 1.6* 0.9  --   ?PROT 6.9 5.8*  --   ?ALBUMIN 4.3 3.6 3.4*  ? ? ?No results for input(s): LIPASE, AMYLASE in the last 168 hours. ?No results for input(s): AMMONIA in the last 168  hours. ? ?CBC: ?Recent Labs  ?Lab 02/28/22 ?1056 03/01/22 ?0458 03/02/22 ?0433 03/02/22 ?1601 03/03/22 ?03/05/22 03/04/22 ?0404  ?WBC 9.5 8.2 11.5* 8.9 8.6 7.5  ?NEUTROABS 7.3  --  9.5*  --  6.1 5.7  ?HGB 13.8 13.1 13.9 13.7 13.0 13.8  ?HCT 41.0 38.7* 40.4 39.9 37.7* 39.2  ?MCV 89.9 91.7 89.8 88.3 87.5 87.5  ?PLT 178 153 165 180 163 166  ? ? ? ?Cardiac Enzymes: ?Recent Labs  ?Lab 02/28/22 ?1056 03/01/22 ?0458  ?CKTOTAL 163 113  ? ? ? ?BNP: ?Invalid input(s): POCBNP ? ?CBG: ?No results for input(s): GLUCAP in the last 168 hours. ? ?Microbiology: ?Results for orders placed or performed during the hospital encounter of 02/28/22  ?Urine Culture     Status: None  ? Collection Time: 02/28/22 10:39 AM  ? Specimen: Urine, Random  ?Result Value Ref Range Status  ? Specimen Description   Final  ?  URINE, CLEAN CATCH ?Performed at [facility], [address], [place], [place] ?  ? Special Requests   Final  ?  NONE ?Performed at [facility], [address] [place], [place] ?  ? Culture   Final  ?  NO GROWTH ?Performed at [facility], [address], [place], [place] [address] ?  ? Report Status 03/02/2022 FINAL  Final  ?Group A Strep by PCR     Status: None  ? Collection Time: 03/01/22  2:31 AM  ? Specimen: Throat; Sterile Swab  ?Result Value Ref Range Status  ? Group A Strep by PCR NOT DETECTED NOT DETECTED Final  ?  Comment: Performed at [facility], [address], [place], [place] [address]  ?Chlamydia/NGC rt PCR ([facility] only)     Status: None  ? Collection Time: 03/01/22  2:31 AM  ? Specimen: Urine; GU  ?Result Value Ref Range Status  ? Specimen source GC/Chlam URINE, RANDOM  Final  ? Chlamydia Tr NOT DETECTED NOT DETECTED Final  ? N gonorrhoeae NOT DETECTED NOT DETECTED Final  ?  Comment: (NOTE) ?This CT/NG assay has not been evaluated in patients with a history of  ?hysterectomy. ?Performed at [facility], [address] [place], ?[place] [address] ?   ? ? ?Coagulation Studies: ?Recent Labs  ?  03/02/22 ?0433  ?LABPROT 14.2  ?INR 1.1  ? ? ? ?Urinalysis: ?Recent Labs  ?  03/03/22 ?1615  ?COLORURINE COLORLESS*  ?LABSPEC 1.003*  ?PHURINE 6.0  ?GLUCOSEU NEGATIVE  ?HGBUR MODERATE*  ?BILIRUBINUR NEGATIVE  ?KETONESUR NEGATIVE  ?PROTEINUR 30*  ?NITRITE NEGATIVE  ?LEUKOCYTESUR NEGATIVE  ? ?  ? ? ?Imaging: ?No results found. ? ? ?Medications:  ? ? ? lidocaine  1 patch Transdermal Q24H  ? methocarbamol  750 mg Oral QID  ? polyethylene glycol  17 g Oral Daily  ? sodium chloride flush  3 mL Intravenous Q12H  ? ?acetaminophen, diphenhydrAMINE, hydrALAZINE, HYDROmorphone (DILAUDID) injection, ondansetron **OR** ondansetron (ZOFRAN) IV, oxyCODONE, traZODone ? ?Assessment/ Plan:  ?Mr. Craig Peck is a 26 y.o.  male with no significant past medical history, who was admitted to [facility] on 02/28/2022 for AKI (acute kidney injury) (HCC) [N17.9] ? ? ?Acute kidney injury with proteinuria, nonnephrotic.  Baseline creatinine unknown.  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  Patient reportedly taking a number of supplements including injectable vitamins, male enhancement supplements, and vaping.  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  ?Creatinine continues to rise however may be at peak.  Adequate urine output recorded and appetite remains appropriate. ?Contacted [facility], no biopsy results available.  Preliminary biopsy results may be available as early as this afternoon or tomorrow.  Patient and family notified.  Patient and family were adamant about being transferred to [facility] for further care and treatment. Attempt made yesterday, [facility] on divert. Primary team will attempt again today.  ?Current treatment plan depending on renal biopsy results. ? ?Lab Results  ?Component Value Date  ? CREATININE 4.56 (H) 03/04/2022  ? CREATININE 4.43 (H) 03/03/2022  ? CREATININE 4.17 (H) 03/02/2022  ? ? ?Intake/Output Summary  (Last 24 hours) at 03/04/2022 1237 ?Last data filed at 03/04/2022 0800 ?Gross per 24 hour  ?Intake 480 ml  ?Output 2500 ml  ?Net -2020 ml  ? ? ? ? ? ? LOS: 4 ?Craig Peck ?4/26/202312:37 PM ?  ?

## 2022-03-04 NOTE — Assessment & Plan Note (Signed)
Likely musculoskeletal nature.  Unable to give NSAIDs with kidney function.  Careful with pain medications. ?

## 2022-03-05 LAB — BASIC METABOLIC PANEL
Anion gap: 6 (ref 5–15)
BUN: 29 mg/dL — ABNORMAL HIGH (ref 6–20)
CO2: 27 mmol/L (ref 22–32)
Calcium: 8.7 mg/dL — ABNORMAL LOW (ref 8.9–10.3)
Chloride: 106 mmol/L (ref 98–111)
Creatinine, Ser: 4.02 mg/dL — ABNORMAL HIGH (ref 0.61–1.24)
GFR, Estimated: 20 mL/min — ABNORMAL LOW (ref >60)
Glucose, Bld: 100 mg/dL — ABNORMAL HIGH (ref 70–99)
Potassium: 4.1 mmol/L (ref 3.5–5.1)
Sodium: 139 mmol/L (ref 135–145)

## 2022-03-05 MED ORDER — OXYCODONE HCL 5 MG PO TABS: 5.0000 mg | ORAL_TABLET | Freq: Four times a day (QID) | ORAL | 0 refills | Status: AC | PRN

## 2022-03-05 MED ORDER — DIPHENHYDRAMINE HCL 25 MG PO CAPS: 25.0000 mg | ORAL_CAPSULE | Freq: Four times a day (QID) | ORAL | 0 refills | Status: AC | PRN

## 2022-03-05 MED ORDER — METHOCARBAMOL 750 MG PO TABS: 750.0000 mg | ORAL_TABLET | Freq: Three times a day (TID) | ORAL | 0 refills | Status: AC | PRN

## 2022-03-05 MED ORDER — ACETAMINOPHEN 325 MG PO TABS: 650.0000 mg | ORAL_TABLET | Freq: Four times a day (QID) | ORAL | Status: AC | PRN

## 2022-03-05 MED ORDER — POLYETHYLENE GLYCOL 3350 17 G PO PACK: 17.0000 g | PACK | Freq: Every day | ORAL | 0 refills | Status: AC | PRN

## 2022-03-05 NOTE — Discharge Instructions (Addendum)
If the lidoderm patch helped you, then you can use salonpas 4% over the counter ? ?Don't drive or operate heavy machinery if taking pain meds (oxycodone) and/or muscle relaxer (methocarbamol) ?

## 2022-03-05 NOTE — Progress Notes (Signed)
?[facility]  ?ROUNDING NOTE  ? ?Subjective:  ? ?Patient seen laying across bed ?Family at bedside including mother and brother. ?States he feels better. ?Complains of abdominal soreness ? ?Creatinine 4.02 ?Urine output 2.5 L in 24 hours ? ?Objective:  ?Vital signs in last 24 hours:  ?Temp:  [98.5 ?F (36.9 ?C)-100 ?F (37.8 ?C)] 98.5 ?F (36.9 ?C) (04/27 9417) ?Pulse Rate:  [59-75] 59 (04/27 0748) ?Resp:  [15-18] 16 (04/27 0748) ?BP: (108-127)/(72-88) 125/88 (04/27 4081) ?SpO2:  [96 %-100 %] 98 % (04/27 0748) ? ?Weight change:  ?Filed Weights  ? 02/28/22 1034  ?Weight: 62.6 kg  ? ? ?Intake/Output: ?I/O last 3 completed shifts: ?In: 480 [P.O.:480] ?Out: 4000 [Urine:4000] ?  ?Intake/Output this shift: ? No intake/output data recorded. ? ?Physical Exam: ?General: NAD  ?Head: Normocephalic, atraumatic. Moist oral mucosal membranes  ?Eyes: Anicteric  ?Lungs:  Clear to auscultation, normal effort  ?Heart: Regular rate and rhythm  ?Abdomen:  Soft, nontender  ?Extremities:  No peripheral edema.  ?Neurologic: Nonfocal, moving all four extremities  ?Skin: No lesions  ?Access: None  ? ? ?Basic Metabolic Panel: ?Recent Labs  ?Lab 03/02/22 ?0433 03/02/22 ?1601 03/03/22 ?0331 03/04/22 ?0404 03/05/22 ?0457  ?NA 139 139 135 139 139  ?K 4.0 3.5 3.7 4.0 4.1  ?CL 108 107 105 106 106  ?CO2 24 24 24 26 27   ?GLUCOSE 93 101* 95 107* 100*  ?BUN 29* 32* 33* 28* 29*  ?CREATININE 3.94* 4.17* 4.43* 4.56* 4.02*  ?CALCIUM 8.2* 8.1* 8.1* 8.5* 8.7*  ?PHOS  --  4.8*  --   --   --   ? ? ? ?Liver Function Tests: ?Recent Labs  ?Lab 02/28/22 ?1056 03/01/22 ?0458 03/02/22 ?1601  ?AST 28 21  --   ?ALT 20 14  --   ?ALKPHOS 57 47  --   ?BILITOT 1.6* 0.9  --   ?PROT 6.9 5.8*  --   ?ALBUMIN 4.3 3.6 3.4*  ? ? ?No results for input(s): LIPASE, AMYLASE in the last 168 hours. ?No results for input(s): AMMONIA in the last 168 hours. ? ?CBC: ?Recent Labs  ?Lab 02/28/22 ?1056 03/01/22 ?0458 03/02/22 ?0433 03/02/22 ?1601 03/03/22 ?03/05/22 03/04/22 ?0404   ?WBC 9.5 8.2 11.5* 8.9 8.6 7.5  ?NEUTROABS 7.3  --  9.5*  --  6.1 5.7  ?HGB 13.8 13.1 13.9 13.7 13.0 13.8  ?HCT 41.0 38.7* 40.4 39.9 37.7* 39.2  ?MCV 89.9 91.7 89.8 88.3 87.5 87.5  ?PLT 178 153 165 180 163 166  ? ? ? ?Cardiac Enzymes: ?Recent Labs  ?Lab 02/28/22 ?1056 03/01/22 ?0458  ?CKTOTAL 163 113  ? ? ? ?BNP: ?Invalid input(s): POCBNP ? ?CBG: ?No results for input(s): GLUCAP in the last 168 hours. ? ?Microbiology: ?Results for orders placed or performed during the hospital encounter of 02/28/22  ?Urine Culture     Status: None  ? Collection Time: 02/28/22 10:39 AM  ? Specimen: Urine, Random  ?Result Value Ref Range Status  ? Specimen Description   Final  ?  URINE, CLEAN CATCH ?Performed at [facility], [address], [place], [place] ?  ? Special Requests   Final  ?  NONE ?Performed at [facility], [address], [place], [place] ?  ? Culture   Final  ?  NO GROWTH ?Performed at [facility], [address], [place], [place] ?  ? Report Status 03/02/2022 FINAL  Final  ?Group A Strep by PCR  Status: None  ? Collection Time: 03/01/22  2:31 AM  ? Specimen: Throat; Sterile Swab  ?Result Value Ref Range Status  ? Group A Strep by PCR NOT DETECTED NOT DETECTED Final  ?  Comment: Performed at [facility], [address], [place], [place] [address]  ?Chlamydia/NGC rt PCR ([facility] only)     Status: None  ? Collection Time: 03/01/22  2:31 AM  ? Specimen: Urine; GU  ?Result Value Ref Range Status  ? Specimen source GC/Chlam URINE, RANDOM  Final  ? Chlamydia Tr NOT DETECTED NOT DETECTED Final  ? N gonorrhoeae NOT DETECTED NOT DETECTED Final  ?  Comment: (NOTE) ?This CT/NG assay has not been evaluated in patients with a history of  ?hysterectomy. ?Performed at [facility], [address] [place], ?[place] [address] ?  ? ? ?Coagulation Studies: ?No results for input(s): LABPROT, INR in the last 72 hours. ? ? ?Urinalysis: ?Recent Labs  ?   03/03/22 ?1615  ?COLORURINE COLORLESS*  ?LABSPEC 1.003*  ?PHURINE 6.0  ?GLUCOSEU NEGATIVE  ?HGBUR MODERATE*  ?BILIRUBINUR NEGATIVE  ?KETONESUR NEGATIVE  ?PROTEINUR 30*  ?NITRITE NEGATIVE  ?LEUKOCYTESUR NEGATIVE  ? ?  ? ? ?Imaging: ?No results found. ? ? ?Medications:  ? ? ? lidocaine  1 patch Transdermal Q24H  ? methocarbamol  750 mg Oral QID  ? polyethylene glycol  17 g Oral Daily  ? sodium chloride flush  3 mL Intravenous Q12H  ? ?acetaminophen, diphenhydrAMINE, hydrALAZINE, HYDROmorphone (DILAUDID) injection, ondansetron **OR** ondansetron (ZOFRAN) IV, oxyCODONE, traZODone ? ?Assessment/ Plan:  ?Mr. Craig Peck is a 26 y.o.  male with no significant past medical history, who was admitted to [facility] on 02/28/2022 for AKI (acute kidney injury) (HCC) [N17.9] ? ? ?Acute kidney injury with proteinuria, nonnephrotic.  Baseline creatinine unknown.  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  Patient reportedly taking a number of supplements including injectable vitamins, male enhancement supplements, and vaping.  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  ?Creatinine improving with adequate urine output. Renal biopsy results received from [facility]. Biopsy results negative for concerning findings. Pateint is cleared to discharge and will follow up in our office in 1-2 weeks for labs and monitoring.  ? ?Lab Results  ?Component Value Date  ? CREATININE 4.02 (H) 03/05/2022  ? CREATININE 4.56 (H) 03/04/2022  ? CREATININE 4.43 (H) 03/03/2022  ? ? ?Intake/Output Summary (Last 24 hours) at 03/05/2022 1332 ?Last data filed at 03/04/2022 1435 ?Gross per 24 hour  ?Intake --  ?Output 800 ml  ?Net -800 ml  ? ? ? ? ? ? LOS: 5 ?Craig Peck ?4/27/20231:32 PM ?  ?

## 2022-03-05 NOTE — TOC Transition Note (Signed)
Transition of Care (TOC) - CM/SW Discharge Note ? ? ?Patient Details  ?Name: Craig Peck ?MRN: [id number] ?Date of Birth: 06-08-1996 ? ?Transition of Care (TOC) CM/SW Contact:  ?Chapman Fitch, RN ?Phone Number: ?03/05/2022, 8:52 AM ? ? ?Clinical Narrative:    ? ?Tylenol, Benadryl, miralax can be purchased over the counter.  Pain Medication to be paid out of pocket at [facility].  No goodrx coupons available .  ? ?Final next level of care: Home/Self Care ?Barriers to Discharge: Continued Medical Work up ? ? ?Patient Goals and CMS Choice ?  ?  ?  ? ?Discharge Placement ?  ?           ?  ?  ?  ?  ? ?Discharge Plan and Services ?In-house Referral: Clinical Social Work ?  ?           ?  ?  ?  ?  ?  ?  ?  ?  ?  ?  ? ?Social Determinants of Health (SDOH) Interventions ?  ? ? ?Readmission Risk Interventions ?   ? View : No data to display.  ?  ?  ?  ? ? ? ? ? ?

## 2022-03-05 NOTE — Plan of Care (Signed)
Patient ID: Craig Peck, male   DOB: Mar 08, 1996, 26 y.o.   MRN: [id number] ? ?Problem: Education: ?Goal: Knowledge of General Education information will improve ?Description: Including pain rating scale, medication(s)/side effects and non-pharmacologic comfort measures ?Outcome: Adequate for Discharge ?  ?Problem: Health Behavior/Discharge Planning: ?Goal: Ability to manage health-related needs will improve ?Outcome: Adequate for Discharge ?  ?Problem: Clinical Measurements: ?Goal: Ability to maintain clinical measurements within normal limits will improve ?Outcome: Adequate for Discharge ?Goal: Will remain free from infection ?Outcome: Adequate for Discharge ?Goal: Diagnostic test results will improve ?Outcome: Adequate for Discharge ?Goal: Respiratory complications will improve ?Outcome: Adequate for Discharge ?Goal: Cardiovascular complication will be avoided ?Outcome: Adequate for Discharge ?  ?Problem: Activity: ?Goal: Risk for activity intolerance will decrease ?Outcome: Adequate for Discharge ?  ?Problem: Nutrition: ?Goal: Adequate nutrition will be maintained ?Outcome: Adequate for Discharge ?  ?Problem: Coping: ?Goal: Level of anxiety will decrease ?Outcome: Adequate for Discharge ?  ?Problem: Elimination: ?Goal: Will not experience complications related to bowel motility ?Outcome: Adequate for Discharge ?Goal: Will not experience complications related to urinary retention ?Outcome: Adequate for Discharge ?  ?Problem: Pain Managment: ?Goal: General experience of comfort will improve ?Outcome: Adequate for Discharge ?  ?Problem: Safety: ?Goal: Ability to remain free from injury will improve ?Outcome: Adequate for Discharge ?  ?Problem: Skin Integrity: ?Goal: Risk for impaired skin integrity will decrease ?Outcome: Adequate for Discharge ?  ? ?Lidia Collum, RN ? ?

## 2022-03-05 NOTE — Discharge Summary (Signed)
?Physician Discharge Summary ?  ?Patient: Craig Peck MRN: [id number] DOB: November 20, 1995  ?Admit date:     02/28/2022  ?Discharge date: 03/05/22  ?Discharge Physician: Alford Highland  ? ?PCP: [facility]  ? ?Recommendations at discharge:  ? ?Follow-up PCP ?Follow-up nephrology 1 week ? ?Discharge Diagnoses: ?Principal Problem: ?  AKI (acute kidney injury) (HCC) ?Active Problems: ?  Back pain ?  Itching ?  Hyperbilirubinemia ? ? ?Hospital Course: ?The patient was admitted to the hospital on 02/28/2022.  He came in with bilateral flank pain and admitted with acute kidney injury.  He was started on IV fluid hydration and nephrology was consulted.  Creatinine 2.3 on presentation and continued to worsen.  Creatinine peaked at 4.56.  Upon discharge creatinine down to 4.02.  Kidney biopsy came back negative.  Nephrology will follow-up as outpatient.  Advised low-salt diet until kidneys improve.  Advise no anti-inflammatories Advil Motrin Aleve BC powder Goody powder or other injectables vitamins.  No vaping. ? ?Assessment and Plan: ?* AKI (acute kidney injury) (HCC) ?Creatinine continue to rise but less increased than previous.  Creatinine peaked at 4.56 on 03/04/2022.  Kidney biopsy showed normal kidney as per nephrology.  I explained the kidneys are quick to impair and slow to improve.  Will need close follow-up upon disposition.  Creatinine upon discharge 4.03.  Nephrology was okay with following up as outpatient. ? ?Back pain ?Likely musculoskeletal nature.  Unable to give NSAIDs with kidney function.  Careful with pain medications. ? ?Itching ?As needed Benadryl.  Continue to taper off pain medications. ? ?Hyperbilirubinemia ?Bilirubin in normal range.  This has resolved. ? ? ? ? ?  ? ? ?Consultants: Nephrology ?Procedures performed: Kidney biopsy ?Disposition: Home ?Diet recommendation:  ?Cardiac diet ?DISCHARGE MEDICATION: ?Allergies as of 03/05/2022   ?No Known Allergies ?  ? ?   ?Medication List  ?  ? ?STOP taking these medications   ? ?dicyclomine 10 MG capsule ?Commonly known as: BENTYL ?  ?ondansetron 4 MG disintegrating tablet ?Commonly known as: ZOFRAN-ODT ?  ? ?  ? ?TAKE these medications   ? ?acetaminophen 325 MG tablet ?Commonly known as: TYLENOL ?Take 2 tablets (650 mg total) by mouth every 6 (six) hours as needed for mild pain, fever, headache or moderate pain. ?  ?diphenhydrAMINE 25 mg capsule ?Commonly known as: BENADRYL ?Take 1 capsule (25 mg total) by mouth every 6 (six) hours as needed for itching. ?  ?methocarbamol 750 MG tablet ?Commonly known as: ROBAXIN ?Take 1 tablet (750 mg total) by mouth every 8 (eight) hours as needed for muscle spasms. ?  ?oxyCODONE 5 MG immediate release tablet ?Commonly known as: Oxy IR/ROXICODONE ?Take 1 tablet (5 mg total) by mouth every 6 (six) hours as needed for moderate pain. ?  ?polyethylene glycol 17 g packet ?Commonly known as: MIRALAX / GLYCOLAX ?Take 17 g by mouth daily as needed for moderate constipation. ?  ? ?  ? ? Follow-up Information   ? ? [facility]. Go on 03/20/2022.   ?Why: appointment time at 11:00 am ?Contact information: ?[address] ?[place] [address] ?[phone] ? ? ?  ?  ? ? Mady Haagensen, MD. Nyra Capes on 03/18/2022.   ?Specialty: Nephrology ?Why: Arrival time is at 2:10 pm. Appointment time is at 2:20 pm. ?Contact information: ?[address] ?[address] ?[place] [address] ?[phone] ? ? ?  ?  ? ?  ?  ? ?  ? ?Discharge Exam: ?Filed Weights  ?  02/28/22 1034  ?Weight: 62.6 kg  ? ?Physical Exam ?HENT:  ?   Head: Normocephalic.  ?   Mouth/Throat:  ?   Pharynx: No oropharyngeal exudate.  ?Eyes:  ?   General: Lids are normal.  ?   Conjunctiva/sclera: Conjunctivae normal.  ?Cardiovascular:  ?   Rate and Rhythm: Normal rate and regular rhythm.  ?   Heart sounds: Normal heart sounds, S1 normal and S2 normal.  ?Pulmonary:  ?   Breath sounds: Normal breath sounds. No decreased breath sounds, wheezing,  rhonchi or rales.  ?Abdominal:  ?   Palpations: Abdomen is soft.  ?   Tenderness: There is no abdominal tenderness.  ?Musculoskeletal:  ?   Right lower leg: No swelling.  ?   Left lower leg: No swelling.  ?Skin: ?   General: Skin is warm.  ?   Findings: No rash.  ?Neurological:  ?   Mental Status: He is alert and oriented to person, place, and time.  ?  ? ?Condition at discharge: stable ? ?The results of significant diagnostics from this hospitalization (including imaging, microbiology, ancillary and laboratory) are listed below for reference.  ? ?Imaging Studies: ?US Biopsy-No Radiologist ? ?Result Date: 03/02/2022 ?INDICATION: Acute kidney injury and proteinuria. Left renal biopsy performed by Dr. Cherylann RatelLateef. EXAM: Intra procedural ultrasound guidance MEDICATIONS: None. ANESTHESIA/SEDATION: Moderate (conscious) sedation was employed during this procedure. A total of Versed 0 mg and Fentanyl 0 mcg was administered intravenously by the radiology nurse. Total intra-service moderate Sedation Time: 0 minutes. The patient's level of consciousness and vital signs were monitored continuously by radiology nursing throughout the procedure under my direct supervision. COMPLICATIONS: None immediate. PROCEDURE: Informed written consent was obtained from the patient by Dr. Cherylann RatelLateef. Three core biopsies were performed of the posteroinferior left renal cortex. No significant perinephric hematoma on immediate postprocedural ultrasound. IMPRESSION: Three core biopsies were performed of the posteroinferior left renal cortex without immediate complications performed by Dr.Lateef. Electronically Signed   By: Elige KoHetal  Patel M.D.   On: 03/02/2022 10:24  ? ?CT Renal Stone Study ? ?Result Date: 02/28/2022 ?CLINICAL DATA:  26 year old male with history of mid back pain radiating to both sides, worsening with urination. Emesis. EXAM: CT ABDOMEN AND PELVIS WITHOUT CONTRAST TECHNIQUE: Multidetector CT imaging of the abdomen and pelvis was performed  following the standard protocol without IV contrast. RADIATION DOSE REDUCTION: This exam was performed according to the departmental dose-optimization program which includes automated exposure control, adjustment of the mA and/or kV according to patient size and/or use of iterative reconstruction technique. COMPARISON:  CT of the abdomen and pelvis 12/12/2020. FINDINGS: Lower chest: Unremarkable. Hepatobiliary: No definite suspicious cystic or solid hepatic lesions are confidently identified on today's noncontrast CT examination. Unenhanced appearance of the gallbladder is normal. Pancreas: No definite pancreatic mass or peripancreatic fluid collections or inflammatory changes are noted on today's noncontrast CT examination. Spleen: Unremarkable. Adrenals/Urinary Tract: There are no abnormal calcifications within the collecting system of either kidney, along the course of either ureter, or within the lumen of the urinary bladder. No hydroureteronephrosis or perinephric stranding to suggest urinary tract obstruction at this time. The unenhanced appearance of the kidneys is unremarkable bilaterally. Unenhanced appearance of the urinary bladder is normal. Bilateral adrenal glands are normal in appearance. Stomach/Bowel: Unenhanced appearance of the stomach is normal. There is no pathologic dilatation of small bowel or colon. Normal appendix. Vascular/Lymphatic: No atherosclerotic calcifications are noted in the abdominal aorta or pelvic vasculature. No lymphadenopathy noted in the abdomen or  pelvis. Reproductive: Prostate gland and seminal vesicles are unremarkable in appearance. Other: No significant volume of ascites.  No pneumoperitoneum. Musculoskeletal: There are no aggressive appearing lytic or blastic lesions noted in the visualized portions of the skeleton. IMPRESSION: 1. No acute findings are noted in the abdomen or pelvis to account for the patient's symptoms. Specifically, no urinary tract calculi no  findings of urinary tract obstruction are noted at this time. Electronically Signed   By: Trudie Reed M.D.   On: 02/28/2022 10:55   ? ?Microbiology: ?Results for orders placed or performed during the hospital encount

## 2022-03-06 LAB — CRYOGLOBULIN

## 2022-03-10 ENCOUNTER — Encounter: Payer: Self-pay | Admitting: Nephrology

## 2022-03-10 LAB — SURGICAL PATHOLOGY

## 2023-04-23 ENCOUNTER — Emergency Department: Payer: Self-pay

## 2023-04-23 ENCOUNTER — Other Ambulatory Visit: Payer: Self-pay

## 2023-04-23 DIAGNOSIS — W450XXA Nail entering through skin, initial encounter: Secondary | ICD-10-CM | POA: Insufficient documentation

## 2023-04-23 DIAGNOSIS — Z23 Encounter for immunization: Secondary | ICD-10-CM | POA: Insufficient documentation

## 2023-04-23 DIAGNOSIS — S61412A Laceration without foreign body of left hand, initial encounter: Secondary | ICD-10-CM | POA: Insufficient documentation

## 2023-04-23 NOTE — ED Triage Notes (Addendum)
Pt to ed from home via POV for laceration to palm area next to left thumb. Pt advised it was punctured with a large nail. Pt is caox4, in no acute distress and ambulatory in triage. Bleeding controlled at this time. Pt did pour salt in the wound to stop the bleeding before coming to the ER.

## 2023-04-24 ENCOUNTER — Emergency Department
Admission: EM | Admit: 2023-04-24 | Discharge: 2023-04-24 | Disposition: A | Discharge status: Home / Self Care | Payer: Self-pay | Attending: Emergency Medicine | Admitting: Emergency Medicine

## 2023-04-24 DIAGNOSIS — S61412A Laceration without foreign body of left hand, initial encounter: Secondary | ICD-10-CM

## 2023-04-24 MED ORDER — LIDOCAINE HCL (PF) 1 % IJ SOLN
5.0000 mL | Freq: Once | INTRAMUSCULAR | Status: AC
  Administered 2023-04-24: 5 mL via INTRADERMAL
  Filled 2023-04-24: qty 5

## 2023-04-24 MED ORDER — TETANUS-DIPHTH-ACELL PERTUSSIS 5-2.5-18.5 LF-MCG/0.5 IM SUSY
0.5000 mL | PREFILLED_SYRINGE | Freq: Once | INTRAMUSCULAR | Status: AC
Start: 2023-04-24 — End: 2023-04-24
  Administered 2023-04-24: 0.5 mL via INTRAMUSCULAR

## 2023-04-24 MED ORDER — DOXYCYCLINE HYCLATE 50 MG PO CAPS: 100.0000 mg | ORAL_CAPSULE | Freq: Two times a day (BID) | ORAL | 0 refills | Status: AC

## 2023-04-24 NOTE — ED Provider Notes (Signed)
[facility] Provider Note   Event Date/Time   First MD Initiated Contact with Patient 04/24/23 [phone]     (approximate) History  Laceration (Left palm/thumb area)  HPI Craig Peck is a 27 y.o. male who presents after a puncture wound to the left palm.  Patient states that he had a nail enter through the palmar aspect of the left palm overlying the thenar musculature.  Patient states that he extracted this nail as soon as it went in but notes that it was rusty.  Patient currently endorses 10/10, nonradiating pain to the left hand.  Patient states initially there was pulsatile bleeding that has stopped at this time ROS: Patient currently denies any vision changes, tinnitus, difficulty speaking, facial droop, sore throat, chest pain, shortness of breath, abdominal pain, nausea/vomiting/diarrhea, dysuria, or weakness/numbness/paresthesias in any extremity   Physical Exam  Triage Vital Signs: ED Triage Vitals  Enc Vitals Group     BP 04/23/23 2233 (!) 134/91     Pulse Rate 04/23/23 2233 65     Resp 04/23/23 2233 16     Temp 04/23/23 2233 98.2 F (36.8 C)     Temp Source 04/23/23 2233 Oral     SpO2 04/23/23 2233 100 %     Weight 04/23/23 2234 136 lb 11 oz (62 kg)     Height 04/23/23 2234 5\' 8"  (1.727 m)     Head Circumference --      Peak Flow --      Pain Score 04/23/23 2233 10     Pain Loc --      Pain Edu? --      Excl. in GC? --    Most recent vital signs: Vitals:   04/23/23 2233  BP: (!) 134/91  Pulse: 65  Resp: 16  Temp: 98.2 F (36.8 C)  SpO2: 100%   General: Awake, oriented x4. CV:  Good peripheral perfusion.  Resp:  Normal effort.  Abd:  No distention.  Other:  Young adult well-developed Hispanic male laying in stretcher in no acute distress.  There is a 3 cm laceration to the left palmar aspect of the thenar musculature that is hemostatic ED Results / Procedures / Treatments  Labs (all labs ordered are listed, but only abnormal  results are displayed) Labs Reviewed - No data to display RADIOLOGY ED MD interpretation: X-ray of the left hand interpreted independently by me and shows only soft tissue swelling with no radiopaque foreign bodies -Agree with radiology assessment Official radiology report(s): DG Hand Complete Left  Result Date: 04/23/2023 CLINICAL DATA:  Foreign body. Nail went through the base of the left thumb. EXAM: LEFT HAND - COMPLETE 3+ VIEW COMPARISON:  None Available. FINDINGS: Soft tissue swelling over the first metacarpal region. No radiopaque soft tissue foreign bodies or soft tissue gas collections. Bones appear intact. No evidence of acute fracture or dislocation. No focal bone lesions. Joint spaces are normal. IMPRESSION: Soft tissue swelling. No radiopaque foreign bodies. No acute bony abnormalities. Electronically Signed   By: Burman Nieves M.D.   On: 04/23/2023 23:13   PROCEDURES: Critical Care performed: No ..Laceration Repair  Date/Time: 04/24/2023 3:27 AM  Performed by: Merwyn Katos, MD Authorized by: Merwyn Katos, MD   Consent:    Consent obtained:  Verbal   Consent given by:  Patient   Risks, benefits, and alternatives were discussed: yes     Risks discussed:  Infection, pain, retained foreign body, need for additional repair, poor  cosmetic result, tendon damage, vascular damage, poor wound healing and nerve damage   Alternatives discussed:  No treatment, delayed treatment, observation and referral Universal protocol:    Immediately prior to procedure, a time out was called: yes     Patient identity confirmed:  Verbally with patient Anesthesia:    Anesthesia method:  Local infiltration   Local anesthetic:  Lidocaine 1% w/o epi Laceration details:    Location:  Hand   Hand location:  L palm   Length (cm):  3   Depth (mm):  5 Pre-procedure details:    Preparation:  Patient was prepped and draped in usual sterile fashion Exploration:    Wound exploration: entire depth  of wound visualized     Contaminated: no   Treatment:    Area cleansed with:  Povidone-iodine and saline   Amount of cleaning:  Standard   Irrigation solution:  Sterile saline   Irrigation volume:  200   Irrigation method:  Syringe   Visualized foreign bodies/material removed: no   Skin repair:    Repair method:  Sutures   Suture size:  3-0   Suture material:  Nylon   Suture technique:  Simple interrupted   Number of sutures:  4 Approximation:    Approximation:  Close Repair type:    Repair type:  Simple Post-procedure details:    Dressing:  Antibiotic ointment and non-adherent dressing   Procedure completion:  Tolerated well, no immediate complications  MEDICATIONS ORDERED IN ED: Medications  lidocaine (PF) (XYLOCAINE) 1 % injection 5 mL (5 mLs Intradermal Given by Other 04/24/23 0055)  Tdap (BOOSTRIX) injection 0.5 mL (0.5 mLs Intramuscular Given 04/24/23 0128)   IMPRESSION / MDM / ASSESSMENT AND PLAN / ED COURSE  I reviewed the triage vital signs and the nursing notes.                             Patient's presentation is most consistent with acute presentation with potential threat to life or bodily function. Patient had a laceration that was repaired in the ED after copious irrigation.  Please see laceration procedure note for further details.  After exploration of the wound, there was no evidence of a retained foreign body. No evidence of underlying fracture. TDAP: UTD Interventions: Defer ABX at this time given location, event time, and patient without surrounding signs of infection. Disposition: Discharge. Patient has been given strict wound return precautions and instructions to follow up with their PMD in 2 days for a wound recheck.   FINAL CLINICAL IMPRESSION(S) / ED DIAGNOSES   Final diagnoses:  Laceration of left palm without complication, initial encounter   Rx / DC Orders   ED Discharge Orders          Ordered    doxycycline (VIBRAMYCIN) 50 MG capsule  2  times daily        04/24/23 0115           Note:  This document was prepared using Dragon voice recognition software and may include unintentional dictation errors.   Merwyn Katos, MD 04/24/23 [phone]

## 2023-04-24 NOTE — Discharge Instructions (Addendum)
Please use ibuprofen (Motrin) up to 800 mg every 8 hours, naproxen (Naprosyn) up to 500 mg every 12 hours, and/or acetaminophen (Tylenol) up to 4 g/day for any continued pain.  Please do not use this medication regimen for longer than 7 days 

## 2023-09-17 ENCOUNTER — Emergency Department
Admission: EM | Admit: 2023-09-17 | Discharge: 2023-09-18 | Disposition: A | Discharge status: Home / Self Care | Payer: Self-pay | Attending: Emergency Medicine | Admitting: Emergency Medicine

## 2023-09-17 DIAGNOSIS — R55 Syncope and collapse: Secondary | ICD-10-CM | POA: Insufficient documentation

## 2023-09-18 ENCOUNTER — Encounter: Payer: Self-pay | Admitting: Emergency Medicine

## 2023-09-18 ENCOUNTER — Emergency Department: Payer: Self-pay

## 2023-09-18 ENCOUNTER — Other Ambulatory Visit: Payer: Self-pay

## 2023-09-18 LAB — COMPREHENSIVE METABOLIC PANEL
ALT: 19 U/L (ref 0–44)
AST: 25 U/L (ref 15–41)
Albumin: 4.9 g/dL (ref 3.5–5.0)
Alkaline Phosphatase: 65 U/L (ref 38–126)
Anion gap: 12 (ref 5–15)
BUN: 13 mg/dL (ref 6–20)
CO2: 21 mmol/L — ABNORMAL LOW (ref 22–32)
Calcium: 9.4 mg/dL (ref 8.9–10.3)
Chloride: 105 mmol/L (ref 98–111)
Creatinine, Ser: 1 mg/dL (ref 0.61–1.24)
GFR, Estimated: >60 mL/min (ref >60)
Glucose, Bld: 107 mg/dL — ABNORMAL HIGH (ref 70–99)
Potassium: 3 mmol/L — ABNORMAL LOW (ref 3.5–5.1)
Sodium: 138 mmol/L (ref 135–145)
Total Bilirubin: 0.5 mg/dL (ref <1.2)
Total Protein: 8 g/dL (ref 6.5–8.1)

## 2023-09-18 LAB — CBC WITH DIFFERENTIAL/PLATELET
Abs Immature Granulocytes: 0.03 10*3/uL (ref 0.00–0.07)
Basophils Absolute: 0.1 10*3/uL (ref 0.0–0.1)
Basophils Relative: 1 %
Eosinophils Absolute: 0.1 10*3/uL (ref 0.0–0.5)
Eosinophils Relative: 1 %
HCT: 42.5 % (ref 39.0–52.0)
Hemoglobin: 14.9 g/dL (ref 13.0–17.0)
Immature Granulocytes: 0 %
Lymphocytes Relative: 23 %
Lymphs Abs: 2.4 10*3/uL (ref 0.7–4.0)
MCH: 30.9 pg (ref 26.0–34.0)
MCHC: 35.1 g/dL (ref 30.0–36.0)
MCV: 88.2 fL (ref 80.0–100.0)
Monocytes Absolute: 0.6 10*3/uL (ref 0.1–1.0)
Monocytes Relative: 6 %
Neutro Abs: 7.3 10*3/uL (ref 1.7–7.7)
Neutrophils Relative %: 69 %
Platelets: 283 10*3/uL (ref 150–400)
RBC: 4.82 MIL/uL (ref 4.22–5.81)
RDW: 11.9 % (ref 11.5–15.5)
WBC: 10.5 10*3/uL (ref 4.0–10.5)
nRBC: 0 % (ref 0.0–0.2)

## 2023-09-18 MED ORDER — IOHEXOL 350 MG/ML SOLN
75.0000 mL | Freq: Once | INTRAVENOUS | Status: AC | PRN
Start: 2023-09-18 — End: 2023-09-18
  Administered 2023-09-18: 75 mL via INTRAVENOUS

## 2023-09-18 MED ORDER — POTASSIUM CHLORIDE CRYS ER 20 MEQ PO TBCR
40.0000 meq | EXTENDED_RELEASE_TABLET | Freq: Once | ORAL | Status: AC
  Administered 2023-09-18: 40 meq via ORAL
  Filled 2023-09-18: qty 2
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
## 2023-09-18 NOTE — Discharge Instructions (Signed)
You were seen in the emergency department after passing out (syncope). Please arrange follow-up with a primary care provider in the next few days for further evaluation of your symptoms. Return to the ER immediately if you develop chest pain, shortness of breath, it feels like your heart is racing, repeated episodes, or other new or concerning symptoms.

## 2024-03-03 ENCOUNTER — Emergency Department
Admission: EM | Admit: 2024-03-03 | Discharge: 2024-03-03 | Disposition: A | Discharge status: Home / Self Care | Payer: Self-pay | Attending: Emergency Medicine | Admitting: Emergency Medicine

## 2024-03-03 ENCOUNTER — Encounter: Payer: Self-pay | Admitting: Emergency Medicine

## 2024-03-03 ENCOUNTER — Other Ambulatory Visit: Payer: Self-pay

## 2024-03-03 DIAGNOSIS — T7840XA Allergy, unspecified, initial encounter: Secondary | ICD-10-CM | POA: Insufficient documentation

## 2024-03-03 MED ORDER — REFRESH P.M. OP OINT
1.0000 | TOPICAL_OINTMENT | Freq: Once | OPHTHALMIC | Status: AC
  Administered 2024-03-03: 1 via OPHTHALMIC
  Filled 2024-03-03 (×3): qty 3.5

## 2024-03-03 MED ORDER — PREDNISONE 20 MG PO TABS: 40.0000 mg | ORAL_TABLET | Freq: Every day | ORAL | 0 refills | Status: AC

## 2024-03-03 MED ORDER — EPINEPHRINE 0.3 MG/0.3ML IJ SOAJ: 0.3000 mg | Freq: Once | INTRAMUSCULAR | 0 refills | Status: AC

## 2024-03-03 MED ORDER — EPINEPHRINE 0.3 MG/0.3ML IJ SOAJ: 0.3000 mg | Freq: Once | INTRAMUSCULAR | 0 refills | Status: DC

## 2024-03-03 MED ORDER — LORATADINE 10 MG PO TABS
10.0000 mg | ORAL_TABLET | Freq: Once | ORAL | Status: AC
  Administered 2024-03-03: 10 mg via ORAL
  Filled 2024-03-03: qty 1

## 2024-03-03 MED ORDER — PREDNISONE 20 MG PO TABS
60.0000 mg | ORAL_TABLET | Freq: Once | ORAL | Status: AC
  Administered 2024-03-03: 60 mg via ORAL
  Filled 2024-03-03: qty 3

## 2024-03-03 NOTE — ED Notes (Signed)
 This RN called pharmacy to tube artifical tears.

## 2024-03-03 NOTE — Discharge Instructions (Addendum)
 You have been seen in the Emergency Department (ED) today for an allergic reaction.  You have been stable throughout your stay in the Emergency Department.  Please take your medications as prescribed and follow up with your doctor as indicated.  You should also take over-the-counter Zyrtec or Claritin  around the next seven days according to the dosing instructions on the package.  Please keep your Epi-Pen with you at all times and use it if experience shortness of breath or difficulty breathing or if you believe you are having a severe allergic reaction.  If you use the Epi-Pen, though, please call 911 afterwards or go immediately to your nearest Emergency Department.  Return to the Emergency Department (ED) if you experience any worsening or new symptoms that concern you.

## 2024-03-03 NOTE — ED Provider Notes (Signed)
 [facility] Provider Note   Event Date/Time   First MD Initiated Contact with Patient 03/03/24 [phone]     (approximate)  History   Allergic Reaction  HPI  Craig Peck is a 28 y.o. male reports no medical history.  However medical history is noted that he has previous acute kidney injury in the past   Patient was in his normal health.  Yesterday he noticed that throughout the day his eyes were a bit red and itchy.  Then this morning when he woke up he noticed that he was itching over his entire body, his skin felt red and he feels a bit swollen around his eyes and having some clear tearing.  He feels like he had an allergic reaction  He reports no history of known allergy.  He also did notice a small area on his left arm that looks like it could be a small bug bite that has also been itching.  No known sting or specific insect bite.  No fevers no chills no difficulty breathing but he feels like his throat feels sort of scratchy.  He is not taking any medications     Physical Exam   Triage Vital Signs: ED Triage Vitals  Encounter Vitals Group     BP 03/03/24 0523 (!) 142/81     Systolic BP Percentile --      Diastolic BP Percentile --      Pulse Rate 03/03/24 0523 83     Resp --      Temp 03/03/24 0523 98.1 F (36.7 C)     Temp Source 03/03/24 0523 Oral     SpO2 03/03/24 0523 95 %     Weight 03/03/24 0522 180 lb (81.6 kg)     Height 03/03/24 0522 5\' 8"  (1.727 m)     Head Circumference --      Peak Flow --      Pain Score 03/03/24 0522 10     Pain Loc --      Pain Education --      Exclude from Growth Chart --     Most recent vital signs: Vitals:   03/03/24 0609 03/03/24 0630  BP: 117/70 113/76  Pulse: 66 (!) 56  Resp: 14 12  Temp:    SpO2: 97% 97%     General: Awake, no distress.  Very pleasant.  Normocephalic atraumatic but has some very mild edema over the lower eyelids, clear tearing and some moderate conjunctival injection  bilateral.  The oral is widely patent the lingula and posterior oropharynx are normal without edema.  There is no edema on the remainder of the face or neck.  His skin appears mildly erythematous, he reports itching, and has a small area of 1 urticaria with a central punctate component over his left shoulder where he reports it looks like a "bug bite" CV:  Good peripheral perfusion.  Normal tones and rate Resp:  Normal effort.  Clear bilateral.  Work of breathing is normal respiratory rate even and unlabored normal Abd:  No distention.  Soft nontender nondistended Other:  Skin has mild erythema, reports its pruritic, and has very slight urticaria noted over his shoulders and upper arms   ED Results / Procedures / Treatments   Labs (all labs ordered are listed, but only abnormal results are displayed) Labs Reviewed - No data to display   EKG     RADIOLOGY     PROCEDURES:  Critical Care performed: No  Procedures   MEDICATIONS ORDERED IN ED: Medications  artificial tears (LACRILUBE) ophthalmic ointment (has no administration in time range)  predniSONE  (DELTASONE ) tablet 60 mg (60 mg Oral Given 03/03/24 0531)  loratadine  (CLARITIN ) tablet 10 mg (10 mg Oral Given 03/03/24 0531)     IMPRESSION / MDM / ASSESSMENT AND PLAN / ED COURSE  I reviewed the triage vital signs and the nursing notes.                              Differential diagnosis includes, but is not limited to, possible allergic reaction, exposure, infection though this seems very unlikely etc.  Clinically he has no acute hemodynamic compromise.  He is alert and oriented without evidence of life-threatening angioedema or multisystem anaphylactic type reaction.  Will monitor him closely and start histamine blocker, prednisone .  His symptoms itching clear tearing etc.  Most consistent with some type of allergic reaction to an unknown exposure though a small potential bug bite without evidence of infection is noted over  the left shoulder/deltoid region which could be a potential trigger.    Patient's presentation is most consistent with acute, uncomplicated illness.   No acute hemodynamic or airway compromise.  Fully alert and oriented.  ----------------------------------------- 6:54 AM on 03/03/2024 -----------------------------------------  Patient resting comfortably.  Itching has improved, patient does request something for his eyes reports he continues feel itching and burning.  He has conjunctival injection in both eyes.  Afebrile, itching has overall improved he is awake alert oriented respirating comfortably.  Facial edema appears to have progressed, and patient comfortable with plan for discharge.  He will utilize over-the-counter Zyrtec or Claritin  for the next 7 days as well as brief dose of steroids.  Discussed with the patient will prescribe an epinephrine pen that he can use if he was to have a severe or worsening reaction and then dial 911.  Comfortable with plan to follow-up with allergist   Return precautions and treatment recommendations and follow-up discussed with the patient who is agreeable with the plan.        FINAL CLINICAL IMPRESSION(S) / ED DIAGNOSES   Final diagnoses:  Allergic reaction, initial encounter     Rx / DC Orders   ED Discharge Orders          Ordered    predniSONE  (DELTASONE ) 20 MG tablet  Daily with breakfast        03/03/24 0649    EPINEPHrine 0.3 mg/0.3 mL IJ SOAJ injection   Once        03/03/24 1610             Note:  This document was prepared using Dragon voice recognition software and may include unintentional dictation errors.   Iver Marker, MD 03/03/24 [phone]

## 2024-03-03 NOTE — ED Triage Notes (Addendum)
 Patient ambulatory to triage with steady gait, without difficulty, appears anxious; pt reports that he awoke PTA with generalized itching, throat tightness, body aches with no known cause

## 2024-03-06 MED FILL — White Petrolatum-Mineral Oil Ophth Ointment: OPHTHALMIC | Qty: 3.5 | Status: AC
